# Patient Record
Sex: Female | Born: 1950
Health system: Southern US, Community
[De-identification: ages and names within clinical notes are randomized; demographics above are authoritative.]

## PROBLEM LIST (undated history)

## (undated) ENCOUNTER — Ambulatory Visit (HOSPITAL_COMMUNITY): Payer: No Typology Code available for payment source

## (undated) DIAGNOSIS — F329 Major depressive disorder, single episode, unspecified: Secondary | ICD-10-CM

## (undated) DIAGNOSIS — E669 Obesity, unspecified: Secondary | ICD-10-CM

## (undated) DIAGNOSIS — F32A Depression, unspecified: Secondary | ICD-10-CM

## (undated) DIAGNOSIS — K219 Gastro-esophageal reflux disease without esophagitis: Secondary | ICD-10-CM

## (undated) DIAGNOSIS — I10 Essential (primary) hypertension: Secondary | ICD-10-CM

## (undated) DIAGNOSIS — M1990 Unspecified osteoarthritis, unspecified site: Secondary | ICD-10-CM

## (undated) HISTORY — PX: ANKLE SURGERY: SHX546

## (undated) HISTORY — DX: Essential (primary) hypertension: I10

## (undated) HISTORY — DX: Gastro-esophageal reflux disease without esophagitis: K21.9

## (undated) HISTORY — DX: Major depressive disorder, single episode, unspecified: F32.9

## (undated) HISTORY — DX: Depression, unspecified: F32.A

## (undated) HISTORY — DX: Obesity, unspecified: E66.9

---

## 1997-09-18 ENCOUNTER — Emergency Department (HOSPITAL_COMMUNITY): Admission: EM | Admit: 1997-09-18 | Discharge: 1997-09-18 | Payer: Self-pay | Admitting: Emergency Medicine

## 1999-07-30 ENCOUNTER — Encounter: Payer: Self-pay | Admitting: Family Medicine

## 1999-07-30 ENCOUNTER — Encounter: Admission: RE | Admit: 1999-07-30 | Discharge: 1999-07-30 | Payer: Self-pay | Admitting: Family Medicine

## 1999-08-13 ENCOUNTER — Encounter: Payer: Self-pay | Admitting: Family Medicine

## 1999-08-13 ENCOUNTER — Encounter: Admission: RE | Admit: 1999-08-13 | Discharge: 1999-08-13 | Payer: Self-pay | Admitting: Family Medicine

## 1999-09-01 ENCOUNTER — Encounter: Admission: RE | Admit: 1999-09-01 | Discharge: 1999-11-30 | Payer: Self-pay | Admitting: Family Medicine

## 2000-03-07 ENCOUNTER — Encounter: Admission: RE | Admit: 2000-03-07 | Discharge: 2000-03-07 | Payer: Self-pay | Admitting: *Deleted

## 2000-03-07 ENCOUNTER — Encounter: Payer: Self-pay | Admitting: *Deleted

## 2000-03-23 ENCOUNTER — Ambulatory Visit (HOSPITAL_COMMUNITY): Admission: RE | Admit: 2000-03-23 | Discharge: 2000-03-23 | Payer: Self-pay | Admitting: *Deleted

## 2000-03-23 ENCOUNTER — Encounter (INDEPENDENT_AMBULATORY_CARE_PROVIDER_SITE_OTHER): Payer: Self-pay | Admitting: *Deleted

## 2001-06-15 ENCOUNTER — Other Ambulatory Visit: Admission: RE | Admit: 2001-06-15 | Discharge: 2001-06-15 | Payer: Self-pay | Admitting: Obstetrics and Gynecology

## 2001-10-18 ENCOUNTER — Encounter: Payer: Self-pay | Admitting: Family Medicine

## 2001-10-18 ENCOUNTER — Encounter: Admission: RE | Admit: 2001-10-18 | Discharge: 2001-10-18 | Payer: Self-pay | Admitting: Family Medicine

## 2002-05-02 ENCOUNTER — Emergency Department (HOSPITAL_COMMUNITY): Admission: EM | Admit: 2002-05-02 | Discharge: 2002-05-02 | Payer: Self-pay | Admitting: Emergency Medicine

## 2002-05-02 ENCOUNTER — Encounter: Payer: Self-pay | Admitting: Emergency Medicine

## 2002-05-21 ENCOUNTER — Encounter: Payer: Self-pay | Admitting: Family Medicine

## 2002-05-21 ENCOUNTER — Encounter: Admission: RE | Admit: 2002-05-21 | Discharge: 2002-05-21 | Payer: Self-pay | Admitting: Family Medicine

## 2003-01-14 ENCOUNTER — Ambulatory Visit (HOSPITAL_COMMUNITY): Admission: RE | Admit: 2003-01-14 | Discharge: 2003-01-14 | Payer: Self-pay | Admitting: Orthopedic Surgery

## 2003-01-14 ENCOUNTER — Ambulatory Visit (HOSPITAL_BASED_OUTPATIENT_CLINIC_OR_DEPARTMENT_OTHER): Admission: RE | Admit: 2003-01-14 | Discharge: 2003-01-14 | Payer: Self-pay | Admitting: Orthopedic Surgery

## 2003-07-04 ENCOUNTER — Encounter: Admission: RE | Admit: 2003-07-04 | Discharge: 2003-07-04 | Payer: Self-pay | Admitting: Family Medicine

## 2003-07-30 ENCOUNTER — Ambulatory Visit (HOSPITAL_COMMUNITY): Admission: RE | Admit: 2003-07-30 | Discharge: 2003-07-30 | Payer: Self-pay | Admitting: *Deleted

## 2003-07-30 ENCOUNTER — Encounter (INDEPENDENT_AMBULATORY_CARE_PROVIDER_SITE_OTHER): Payer: Self-pay | Admitting: *Deleted

## 2003-09-16 ENCOUNTER — Other Ambulatory Visit: Admission: RE | Admit: 2003-09-16 | Discharge: 2003-09-16 | Payer: Self-pay | Admitting: Obstetrics and Gynecology

## 2004-07-06 ENCOUNTER — Encounter: Admission: RE | Admit: 2004-07-06 | Discharge: 2004-07-06 | Payer: Self-pay | Admitting: Family Medicine

## 2004-10-21 ENCOUNTER — Other Ambulatory Visit: Admission: RE | Admit: 2004-10-21 | Discharge: 2004-10-21 | Payer: Self-pay | Admitting: Obstetrics and Gynecology

## 2005-03-30 ENCOUNTER — Encounter: Admission: RE | Admit: 2005-03-30 | Discharge: 2005-03-30 | Payer: Self-pay | Admitting: Family Medicine

## 2005-06-01 ENCOUNTER — Emergency Department (HOSPITAL_COMMUNITY): Admission: EM | Admit: 2005-06-01 | Discharge: 2005-06-01 | Payer: Self-pay | Admitting: Family Medicine

## 2005-06-03 ENCOUNTER — Encounter: Admission: RE | Admit: 2005-06-03 | Discharge: 2005-06-03 | Payer: Self-pay | Admitting: Family Medicine

## 2005-07-07 ENCOUNTER — Encounter: Admission: RE | Admit: 2005-07-07 | Discharge: 2005-07-07 | Payer: Self-pay | Admitting: Family Medicine

## 2005-09-28 ENCOUNTER — Emergency Department (HOSPITAL_COMMUNITY): Admission: EM | Admit: 2005-09-28 | Discharge: 2005-09-28 | Payer: Self-pay | Admitting: Family Medicine

## 2005-10-25 ENCOUNTER — Other Ambulatory Visit: Admission: RE | Admit: 2005-10-25 | Discharge: 2005-10-25 | Payer: Self-pay | Admitting: Obstetrics and Gynecology

## 2006-03-24 ENCOUNTER — Emergency Department (HOSPITAL_COMMUNITY): Admission: EM | Admit: 2006-03-24 | Discharge: 2006-03-24 | Payer: Self-pay | Admitting: Emergency Medicine

## 2006-04-14 ENCOUNTER — Encounter: Admission: RE | Admit: 2006-04-14 | Discharge: 2006-04-14 | Payer: Self-pay | Admitting: Family Medicine

## 2006-07-10 ENCOUNTER — Encounter: Admission: RE | Admit: 2006-07-10 | Discharge: 2006-07-10 | Payer: Self-pay | Admitting: Obstetrics and Gynecology

## 2006-10-11 ENCOUNTER — Encounter: Admission: RE | Admit: 2006-10-11 | Discharge: 2006-10-11 | Payer: Self-pay | Admitting: Family Medicine

## 2007-07-16 ENCOUNTER — Encounter: Admission: RE | Admit: 2007-07-16 | Discharge: 2007-07-16 | Payer: Self-pay | Admitting: Family Medicine

## 2007-11-14 ENCOUNTER — Encounter: Admission: RE | Admit: 2007-11-14 | Discharge: 2007-11-14 | Payer: Self-pay | Admitting: Obstetrics and Gynecology

## 2008-07-22 ENCOUNTER — Encounter: Admission: RE | Admit: 2008-07-22 | Discharge: 2008-07-22 | Payer: Self-pay | Admitting: Family Medicine

## 2008-10-02 ENCOUNTER — Emergency Department (HOSPITAL_COMMUNITY): Admission: EM | Admit: 2008-10-02 | Discharge: 2008-10-02 | Payer: Self-pay | Admitting: Emergency Medicine

## 2009-08-28 ENCOUNTER — Encounter: Admission: RE | Admit: 2009-08-28 | Discharge: 2009-08-28 | Payer: Self-pay | Admitting: Family Medicine

## 2010-06-25 NOTE — Op Note (Signed)
NAME:  Ashley Keith, Ashley Keith                      ACCOUNT NO.:  [id number]   MEDICAL RECORD NO.:  [id number]                   PATIENT TYPE:  AMB   LOCATION:  DSC                                  FACILITY:  [facility]   PHYSICIAN:  Leonides Grills, M.D.                  DATE OF BIRTH:  [birth year]   DATE OF PROCEDURE:  01/14/2003  DATE OF DISCHARGE:                                 OPERATIVE REPORT   PREOPERATIVE DIAGNOSIS:  1. Left calcific Achilles tendinitis.  2. Left tight gastroc.  3. Left Haglund's deformity.   POSTOPERATIVE DIAGNOSIS:  1. Left calcific Achilles tendinitis.  2. Left tight gastroc.  3. Left Haglund's deformity.  4. Above plus left Achilles tendon tear.   OPERATION PERFORMED:  1. Excision of left calcification within the Achilles tendon.  2. Excision of left Haglund's deformity.  3. Left gastroc slide.  4. Left primary repair of Achilles tendon.  5. Stress x-rays of ankle.   SURGEON:  Leonides Grills, M.D.   ASSISTANT:  Lianne Cure, P.A.   ANESTHESIA:  General endotracheal tube.   ESTIMATED BLOOD LOSS:  Minimal.   TOURNIQUET TIME:  Approximately hour and a half.   COMPLICATIONS:  None.   DISPOSITION:  Stable to PR.   INDICATIONS FOR PROCEDURE:  The patient is a 60 year old female who has had  a persistent left posterior heel pain that is interfering in her life to the  point that she cannot do what she wants to despite conservative management.  The patient  has consented for the above procedure.  All risks which include  infection, neurovascular injury, persistent pain, worsening pain, stiffness,  rupture of the Achilles tendon were all explained, questions were encouraged  and answered.   DESCRIPTION OF PROCEDURE:  The patient was brought to the operating room and  placed in supine position initially after adequate general endotracheal tube  anesthesia was administered as well as Ancef 1 g IV piggyback.  A tourniquet  was placed on the left thigh and  the patient was placed in the prone  position.  All bony prominences were well padded.  The left lower extremity  was then prepped and draped in sterile manner.  A longitudinal incision over  the medial head of the gastrocnemius tendon was then made.  Dissection was  carried down through skin.  Subcutaneous was dissected down to the fascia.  The fascia was opened in line with the incision.  The conjoined region  between the gastroc soleus was then developed.  Soft tissue was then  elevated off the posterior aspect of the gastrocnemius tendon.  The sural  nerve was identified and protected posteriorly.  The gastroc tendon was then  released with the Mayo scissors.  This had an excellent release of the tight  gastroc.  The wound was copiously irrigated with normal saline.  Subcutaneous tissue was closed with 3-0 Vicryl and the skin was closed with  4-0 Monocryl subcuticular stitch.  Steri-Strips were applied.  The limb was  gravity exsanguinated and the tourniquet elevated to 290 mmHg.  A  longitudinal incision over the anteromedial and anterolateral aspects of the  Achilles tendon was then made.  Dissection was then carried down to bone,  soft tissue was then elevated off the retrocalcaneal space.  With a sagittal  saw, Haglund's deformity was then removed.  This had an excellent  decompression of this area.  Synovitis was removed also with a rongeur.  Then with a curved 1/4 inch osteotome as well as the scalpel and rongeur,  calcification within the Achilles tendon was the removed.  Once this was  meticulously removed, a C-arm view was obtained that showed complete removal  of the calcification within the Achilles tendon.  Because of the compromised  nature of the Achilles tendon with the tear from the tendinosis and  compromised nature of the calcification within the Achilles tendon, a  primary Achilles tendon repair was then performed to the calcaneal tuber  using 5 mm absorbable core  screw suture anchors with #2 FiberWire.  This had  an excellent repair. This was repaired after the area was copiously  irrigated with normal saline and a stress x-ray was obtained and showed that  the area was completely clean of any bony debris in the area as well as the  fact that the tendon was in continuity posteriorly.  Once the wound was  copiously irrigated with normal saline and the primary Achilles tendon  repair was performed, the tourniquet was deflated and hemostasis was  obtained.  The subcutaneous was closed with 3-0 Vicryl.  The skin was closed  with 4-0 nylon.  Sterile dressing was applied.  A modified Jones dressing  was applied with gravity equinus.  The patient was stable to the PR.                                               Leonides Grills, M.D.    PB/MEDQ  D:  01/14/2003  T:  01/15/2003  Job:  [id number]

## 2010-08-18 ENCOUNTER — Other Ambulatory Visit: Payer: Self-pay | Admitting: Family Medicine

## 2010-08-18 DIAGNOSIS — Z1231 Encounter for screening mammogram for malignant neoplasm of breast: Secondary | ICD-10-CM

## 2010-09-06 ENCOUNTER — Ambulatory Visit
Admission: RE | Admit: 2010-09-06 | Discharge: 2010-09-06 | Disposition: A | Discharge status: Home / Self Care | Payer: BC Managed Care – HMO | Source: Ambulatory Visit | Attending: Family Medicine | Admitting: Family Medicine

## 2010-09-06 DIAGNOSIS — Z1231 Encounter for screening mammogram for malignant neoplasm of breast: Secondary | ICD-10-CM

## 2011-01-27 ENCOUNTER — Ambulatory Visit
Admission: RE | Admit: 2011-01-27 | Discharge: 2011-01-27 | Disposition: A | Discharge status: Home / Self Care | Payer: BC Managed Care – HMO | Source: Ambulatory Visit | Attending: Family Medicine | Admitting: Family Medicine

## 2011-01-27 ENCOUNTER — Other Ambulatory Visit: Payer: Self-pay | Admitting: Family Medicine

## 2011-01-27 DIAGNOSIS — M25522 Pain in left elbow: Secondary | ICD-10-CM

## 2011-03-09 ENCOUNTER — Ambulatory Visit (INDEPENDENT_AMBULATORY_CARE_PROVIDER_SITE_OTHER): Payer: BC Managed Care – HMO | Admitting: Internal Medicine

## 2011-03-09 DIAGNOSIS — R059 Cough, unspecified: Secondary | ICD-10-CM

## 2011-03-09 DIAGNOSIS — E119 Type 2 diabetes mellitus without complications: Secondary | ICD-10-CM

## 2011-03-09 DIAGNOSIS — IMO0001 Reserved for inherently not codable concepts without codable children: Secondary | ICD-10-CM | POA: Insufficient documentation

## 2011-03-09 DIAGNOSIS — I10 Essential (primary) hypertension: Secondary | ICD-10-CM | POA: Insufficient documentation

## 2011-03-09 DIAGNOSIS — R05 Cough: Secondary | ICD-10-CM

## 2011-03-09 LAB — POCT CBC
Granulocyte percent: 49.8 %G (ref 37–80)
Lymph, poc: 3.1 (ref 0.6–3.4)
MCH, POC: 29.9 pg (ref 27–31.2)
MCHC: 31.9 g/dL (ref 31.8–35.4)
MID (cbc): 0.5 (ref 0–0.9)
RBC: 4.11 M/uL (ref 4.04–5.48)
RDW, POC: 13.4 %
WBC: 7.3 10*3/uL (ref 4.6–10.2)

## 2011-03-09 MED ORDER — ALBUTEROL SULFATE HFA 108 (90 BASE) MCG/ACT IN AERS: 2.0000 | INHALATION_SPRAY | Freq: Four times a day (QID) | RESPIRATORY_TRACT | Status: DC | PRN

## 2011-03-09 MED ORDER — AZITHROMYCIN 250 MG PO TABS: ORAL_TABLET | ORAL | Status: AC

## 2011-03-09 MED ORDER — ALBUTEROL SULFATE (2.5 MG/3ML) 0.083% IN NEBU
2.5000 mg | INHALATION_SOLUTION | Freq: Once | RESPIRATORY_TRACT | Status: AC
  Administered 2011-03-09: 2.5 mg via RESPIRATORY_TRACT

## 2011-03-09 NOTE — Progress Notes (Signed)
  Subjective:    Patient ID: Ashley Keith, female    DOB: April 16, 1950, 61 y.o.   MRN: [id number]  Cough This is a new problem. The current episode started 1 to 4 weeks ago. The problem has been gradually worsening. The cough is non-productive. Associated symptoms include headaches. Pertinent negatives include no ear pain, fever, nasal congestion or postnasal drip. She has tried OTC cough suppressant for the symptoms. The treatment provided no relief. There is no history of asthma or COPD.      Review of Systems  Constitutional: Negative for fever.  HENT: Negative for ear pain and postnasal drip.   Respiratory: Positive for cough.   Neurological: Positive for headaches.       Objective:   Physical Exam  Constitutional: She is oriented to person, place, and time. She appears well-developed and well-nourished.  HENT:  Head: Normocephalic.  Right Ear: External ear normal.  Left Ear: External ear normal.  Neck: Neck supple.  Cardiovascular: Normal rate, regular rhythm and normal heart sounds.   Abdominal: Soft.  Musculoskeletal: Normal range of motion.  Neurological: She is alert and oriented to person, place, and time.  Skin: Skin is warm and dry.          Assessment & Plan:  Patient regularly seen by Dr. Kevan Ny, her PCP, last AIC was 7.2.  Today's am FBS was 209, she states this is higher than her usual am sugars.

## 2011-03-09 NOTE — Patient Instructions (Signed)
Patient advised to take medications and RTC if not improved in 48-72 hours.

## 2011-04-22 ENCOUNTER — Emergency Department (HOSPITAL_COMMUNITY)
Admission: EM | Admit: 2011-04-22 | Discharge: 2011-04-22 | Disposition: A | Discharge status: Home / Self Care | Payer: BC Managed Care – HMO | Source: Home / Self Care

## 2011-04-22 ENCOUNTER — Encounter (HOSPITAL_COMMUNITY): Payer: Self-pay | Admitting: *Deleted

## 2011-04-22 ENCOUNTER — Emergency Department (INDEPENDENT_AMBULATORY_CARE_PROVIDER_SITE_OTHER): Payer: BC Managed Care – PPO

## 2011-04-22 DIAGNOSIS — S92919A Unspecified fracture of unspecified toe(s), initial encounter for closed fracture: Secondary | ICD-10-CM

## 2011-04-22 DIAGNOSIS — S92501A Displaced unspecified fracture of right lesser toe(s), initial encounter for closed fracture: Secondary | ICD-10-CM

## 2011-04-22 NOTE — ED Provider Notes (Signed)
Medical screening examination/treatment/procedure(s) were performed by non-physician practitioner and as supervising physician I was immediately available for consultation/collaboration.  Leslee Home, M.D.   Reuben Likes, MD 04/22/11 2221

## 2011-04-22 NOTE — ED Provider Notes (Signed)
History     CSN: [id number]  Arrival date & time 04/22/11  1842   None     Chief Complaint  Patient presents with  . Foot Injury    (Consider location/radiation/quality/duration/timing/severity/associated sxs/prior treatment) HPI Comments: Patient presents today with complaints of right foot injury. She states that she was walking out of the bathroom when she had her right foot on the doorjamb. She complains of pain primarily in her right little toe, but also has pain in the fourth toe and across the dorsal aspect of her foot. Pain worsens to palpation and with weightbearing. She is since developed pain swelling and bruising. Patient has a history of diabetes.   Past Medical History  Diagnosis Date  . Diabetes mellitus   . Hypertension     History reviewed. No pertinent past surgical history.  History reviewed. No pertinent family history.  History  Substance Use Topics  . Smoking status: Never Smoker   . Smokeless tobacco: Not on file  . Alcohol Use: No    OB History    Grav Para Term Preterm Abortions TAB SAB Ect Mult Living                  Review of Systems  Musculoskeletal: Negative for joint swelling.  Skin: Positive for color change. Negative for wound.  Neurological: Negative for numbness.    Allergies  Review of patient's allergies indicates no known allergies.  Home Medications   Current Outpatient Rx  Name Route Sig Dispense Refill  . ALBUTEROL SULFATE HFA 108 (90 BASE) MCG/ACT IN AERS Inhalation Inhale 2 puffs into the lungs every 6 (six) hours as needed (cough). 1 Inhaler 0  . AMLODIPINE BESYLATE 10 MG PO TABS Oral Take 10 mg by mouth daily.    . ASPIRIN 81 MG PO TABS Oral Take 81 mg by mouth daily.    Marland Kitchen GLIPIZIDE 10 MG PO TABS Oral Take 10 mg by mouth 2 (two) times daily before a meal.    . INSULIN ASPART 100 UNIT/ML [place] SOLN Subcutaneous Inject 34 Units into the skin 1 day or 1 dose.    Marland Kitchen LISINOPRIL 10 MG PO TABS Oral Take 20 mg by mouth daily.     Marland Kitchen METFORMIN HCL 500 MG PO TABS Oral Take 500 mg by mouth 2 (two) times daily with a meal.      BP 171/67  Pulse 59  Temp(Src) 98.7 F (37.1 C) (Oral)  Resp 18  SpO2 99%  Physical Exam  Nursing note and vitals reviewed. Constitutional: She appears well-developed and well-nourished. No distress.  HENT:  Head: Normocephalic and atraumatic.  Musculoskeletal:       Right foot: She exhibits bony tenderness (very TTP 5th digit, moderate TTP 4th digit, 3rd, & 4th distal metatarsals) and swelling (mild, dorsum of foot). She exhibits normal range of motion, normal capillary refill, no deformity and no laceration.       Feet:  Neurological: She is alert.  Skin: Skin is warm and dry.  Psychiatric: She has a normal mood and affect.    ED Course  Procedures (including critical care time)  Labs Reviewed - No data to display Dg Foot Complete Right  04/22/2011  *RADIOLOGY REPORT*  Clinical Data: Right foot pain  RIGHT FOOT COMPLETE - 3+ VIEW  Comparison: None  Findings: Three views of the right foot submitted.  There is nondisplaced fracture or of proximal phalanx right fifth toe. Plantar spur of the calcaneus is noted.  Posterior spur  of the calcaneus at the Achilles tendon insertion.  IMPRESSION: Nondisplaced fracture proximal phalanx right fifth toe.  Plantar and posterior spurring of the calcaneus.  Original Report Authenticated By: Natasha Mead, M.D.     1. Fracture of fifth toe, right, closed       MDM  Xray reviewed by myself and radiologist.  Discussed home care. Pt has appt scheduled with PCP one week from today - will f/u then.         Melody Comas, [place] 04/22/11 2140

## 2011-04-22 NOTE — Discharge Instructions (Signed)
Tape your 4th and 5th toes together for the next 4 weeks. Change the tape once daily. Put a piece of cotton ball or guaze between your toes to prevent skin irritation. Use the orthopedic shoe until you can comfortably wear your own shoes. Tylenol or Ibuprofen as needed for discomfort. You may also elevate and use ice packs for discomfort as needed. Follow up with Dr Kevan Ny in one week as planned. Return sooner if symptoms change or worsen.

## 2011-04-22 NOTE — ED Notes (Signed)
Pt states she hit her right foot hard on a door and heard a crack.  Complaining of pain in her pinky toe and across the bridge of the foot.  Reports swelling.

## 2011-04-29 ENCOUNTER — Ambulatory Visit (INDEPENDENT_AMBULATORY_CARE_PROVIDER_SITE_OTHER): Payer: BC Managed Care – HMO | Admitting: Ophthalmology

## 2011-04-29 DIAGNOSIS — H43819 Vitreous degeneration, unspecified eye: Secondary | ICD-10-CM

## 2011-04-29 DIAGNOSIS — I10 Essential (primary) hypertension: Secondary | ICD-10-CM

## 2011-04-29 DIAGNOSIS — H35039 Hypertensive retinopathy, unspecified eye: Secondary | ICD-10-CM

## 2011-04-29 DIAGNOSIS — E1139 Type 2 diabetes mellitus with other diabetic ophthalmic complication: Secondary | ICD-10-CM

## 2011-04-29 DIAGNOSIS — H2510 Age-related nuclear cataract, unspecified eye: Secondary | ICD-10-CM

## 2011-04-29 DIAGNOSIS — E11319 Type 2 diabetes mellitus with unspecified diabetic retinopathy without macular edema: Secondary | ICD-10-CM

## 2011-05-23 ENCOUNTER — Other Ambulatory Visit: Payer: Self-pay | Admitting: Family Medicine

## 2011-05-23 DIAGNOSIS — M549 Dorsalgia, unspecified: Secondary | ICD-10-CM

## 2011-05-24 ENCOUNTER — Ambulatory Visit
Admission: RE | Admit: 2011-05-24 | Discharge: 2011-05-24 | Disposition: A | Discharge status: Home / Self Care | Payer: BC Managed Care – PPO | Source: Ambulatory Visit | Attending: Family Medicine | Admitting: Family Medicine

## 2011-05-24 DIAGNOSIS — M549 Dorsalgia, unspecified: Secondary | ICD-10-CM

## 2011-06-04 ENCOUNTER — Encounter (HOSPITAL_COMMUNITY): Payer: Self-pay | Admitting: *Deleted

## 2011-06-04 ENCOUNTER — Emergency Department (INDEPENDENT_AMBULATORY_CARE_PROVIDER_SITE_OTHER): Payer: BC Managed Care – PPO

## 2011-06-04 ENCOUNTER — Emergency Department (HOSPITAL_COMMUNITY)
Admission: EM | Admit: 2011-06-04 | Discharge: 2011-06-04 | Disposition: A | Discharge status: Home / Self Care | Payer: BC Managed Care – PPO | Source: Home / Self Care

## 2011-06-04 DIAGNOSIS — J189 Pneumonia, unspecified organism: Secondary | ICD-10-CM

## 2011-06-04 HISTORY — DX: Unspecified osteoarthritis, unspecified site: M19.90

## 2011-06-04 MED ORDER — LEVOFLOXACIN 500 MG PO TABS: 500.0000 mg | ORAL_TABLET | Freq: Every day | ORAL | Status: AC

## 2011-06-04 NOTE — ED Notes (Signed)
Started with severe sore throat approx 2 wks ago that improved some with gargling and Zyrtec, then sxs progressed into productive cough and nasal congestion.  Now has bilat earache, neck pain, chest pain when coughing.  Has tried Mucinex Max Severe, and Coricidin HBP.  Unable to sleep due to coughing.  Crackles noted in right lower lung.

## 2011-06-04 NOTE — Discharge Instructions (Signed)
Thank you for coming in today. You have pneumonia.  Please take the Levaquin daily starting tonight.  Call or go to the emergency room if you get worse, have trouble breathing, have chest pains, or palpitations.  Followup with your doctor in a week especially if you're still feeling bad. Take Tylenol or ibuprofen for fever.  Pneumonia, Adult Pneumonia is an infection of the lungs.  CAUSES Pneumonia may be caused by bacteria or a virus. Usually, these infections are caused by breathing infectious particles into the lungs (respiratory tract). SYMPTOMS   Cough.   Fever.   Chest pain.   Increased rate of breathing.   Wheezing.   Mucus production.  DIAGNOSIS  If you have the common symptoms of pneumonia, your caregiver will typically confirm the diagnosis with a chest X-ray. The X-ray will show an abnormality in the lung (pulmonary infiltrate) if you have pneumonia. Other tests of your blood, urine, or sputum may be done to find the specific cause of your pneumonia. Your caregiver may also do tests (blood gases or pulse oximetry) to see how well your lungs are working. TREATMENT  Some forms of pneumonia may be spread to other people when you cough or sneeze. You may be asked to wear a mask before and during your exam. Pneumonia that is caused by bacteria is treated with antibiotic medicine. Pneumonia that is caused by the influenza virus may be treated with an antiviral medicine. Most other viral infections must run their course. These infections will not respond to antibiotics.  PREVENTION A pneumococcal shot (vaccine) is available to prevent a common bacterial cause of pneumonia. This is usually suggested for:  People over 2 years old.   Patients on chemotherapy.   People with chronic lung problems, such as bronchitis or emphysema.   People with immune system problems.  If you are over 65 or have a high risk condition, you may receive the pneumococcal vaccine if you have not  received it before. In some countries, a routine influenza vaccine is also recommended. This vaccine can help prevent some cases of pneumonia.You may be offered the influenza vaccine as part of your care. If you smoke, it is time to quit. You may receive instructions on how to stop smoking. Your caregiver can provide medicines and counseling to help you quit. HOME CARE INSTRUCTIONS   Cough suppressants may be used if you are losing too much rest. However, coughing protects you by clearing your lungs. You should avoid using cough suppressants if you can.   Your caregiver may have prescribed medicine if he or she thinks your pneumonia is caused by a bacteria or influenza. Finish your medicine even if you start to feel better.   Your caregiver may also prescribe an expectorant. This loosens the mucus to be coughed up.   Only take over-the-counter or prescription medicines for pain, discomfort, or fever as directed by your caregiver.   Do not smoke. Smoking is a common cause of bronchitis and can contribute to pneumonia. If you are a smoker and continue to smoke, your cough may last several weeks after your pneumonia has cleared.   A cold steam vaporizer or humidifier in your room or home may help loosen mucus.   Coughing is often worse at night. Sleeping in a semi-upright position in a recliner or using a couple pillows under your head will help with this.   Get rest as you feel it is needed. Your body will usually let you know when you need  to rest.  SEEK IMMEDIATE MEDICAL CARE IF:   Your illness becomes worse. This is especially true if you are elderly or weakened from any other disease.   You cannot control your cough with suppressants and are losing sleep.   You begin coughing up blood.   You develop pain which is getting worse or is uncontrolled with medicines.   You have a fever.   Any of the symptoms which initially brought you in for treatment are getting worse rather than  better.   You develop shortness of breath or chest pain.  MAKE SURE YOU:   Understand these instructions.   Will watch your condition.   Will get help right away if you are not doing well or get worse.  Document Released: 01/24/2005 Document Revised: 01/13/2011 Document Reviewed: 04/15/2010 [facility] Patient Information 2012 [place], [place].

## 2011-06-04 NOTE — ED Provider Notes (Signed)
Ashley Keith is a 61 y.o. female who presents to Urgent Care today for nasal congestion, cough, sore throat, crackles with breathing and  mild shortness of breath starting 2 weeks ago. Her symptoms have worsened until she presents to clinic today. She has tried Zyrtec, Mucinex and over-the-counter cough and cold medicine. She denies any current shortness of breath chest pains or palpitations fevers or chills.     PMH reviewed. Significant for diabetes and hypertension ROS as above otherwise neg.  no chest pains, palpitations, fevers, chills, abdominal pain nausea or vomiting. Medications reviewed. No current facility-administered medications for this encounter.   Current Outpatient Prescriptions  Medication Sig Dispense Refill  . amLODipine (NORVASC) 10 MG tablet Take 10 mg by mouth daily.      Marland Kitchen aspirin 81 MG tablet Take 81 mg by mouth daily.      Marland Kitchen glipiZIDE (GLUCOTROL) 10 MG tablet Take 10 mg by mouth 2 (two) times daily before a meal.      . insulin glargine (LANTUS) 100 UNIT/ML injection Inject 42 Units into the skin at bedtime.      Marland Kitchen lisinopril (PRINIVIL,ZESTRIL) 10 MG tablet Take 20 mg by mouth daily.      . metFORMIN (GLUCOPHAGE) 500 MG tablet Take 500 mg by mouth 2 (two) times daily with a meal.      . albuterol (PROVENTIL HFA;VENTOLIN HFA) 108 (90 BASE) MCG/ACT inhaler Inhale 2 puffs into the lungs every 6 (six) hours as needed (cough).  1 Inhaler  0  . insulin aspart (NOVOLOG) 100 UNIT/ML injection Inject 34 Units into the skin 1 day or 1 dose.      . levofloxacin (LEVAQUIN) 500 MG tablet Take 1 tablet (500 mg total) by mouth daily.  10 tablet  0    Exam:  BP 140/79  Pulse 80  Temp(Src) 100.8 F (38.2 C) (Oral)  Resp 24  SpO2 100% Gen: Well NAD HEENT: EOMI,  MMM normal tympanic membranes bilaterally Lungs: Normal work of breathing. Crackles present in the right lower lung fields Heart: RRR no MRG Abd: NABS, NT, ND Exts: Non edematous BL  LE, warm and well perfused.     No results found for this or any previous visit (from the past 24 hour(s)). Dg Chest 2 View  06/04/2011  *RADIOLOGY REPORT*  Clinical Data: Cough and congestion  CHEST - 2 VIEW  Comparison: 04/14/2006  Findings: Heart size is normal.  The aorta shows calcification and unfolding.  The left lung is clear.  I think there is mild patchy infiltrate at the right base.  No dense consolidation or collapse. No effusions.  No bony abnormalities.  IMPRESSION: Mild patchy infiltrate at the right base consistent with mild pneumonia.  Original Report Authenticated By: Thomasenia Sales, M.D.    Assessment and Plan: 61 y.o. female with pneumonia.   Plan to treat with Levaquin and continuing Tylenol.  She appears to be stable currently with no tachypnea or hypoxemia.  Discussed warning signs or symptoms that would prompt return to health care. Advised that she followup with her primary care doctor. She expresses understanding.     Rodolph Bong, MD 06/04/11 [phone]

## 2011-06-06 NOTE — ED Provider Notes (Signed)
Medical screening examination/treatment/procedure(s) were performed by resident physician or non-physician practitioner and as supervising physician I was immediately available for consultation/collaboration.   Barkley Bruns MD.    Linna Hoff, MD 06/06/11 1740

## 2011-07-15 ENCOUNTER — Other Ambulatory Visit: Payer: Self-pay | Admitting: Family Medicine

## 2011-07-15 ENCOUNTER — Ambulatory Visit
Admission: RE | Admit: 2011-07-15 | Discharge: 2011-07-15 | Disposition: A | Discharge status: Home / Self Care | Payer: BC Managed Care – PPO | Source: Ambulatory Visit | Attending: Family Medicine | Admitting: Family Medicine

## 2011-07-15 DIAGNOSIS — J189 Pneumonia, unspecified organism: Secondary | ICD-10-CM

## 2011-09-23 ENCOUNTER — Other Ambulatory Visit: Payer: Self-pay | Admitting: Family Medicine

## 2011-09-23 DIAGNOSIS — Z1231 Encounter for screening mammogram for malignant neoplasm of breast: Secondary | ICD-10-CM

## 2011-09-27 ENCOUNTER — Ambulatory Visit: Payer: BC Managed Care – PPO

## 2011-12-23 ENCOUNTER — Ambulatory Visit
Admission: RE | Admit: 2011-12-23 | Discharge: 2011-12-23 | Disposition: A | Discharge status: Home / Self Care | Payer: BC Managed Care – PPO | Source: Ambulatory Visit | Attending: Family Medicine | Admitting: Family Medicine

## 2011-12-23 DIAGNOSIS — Z1231 Encounter for screening mammogram for malignant neoplasm of breast: Secondary | ICD-10-CM

## 2012-04-30 ENCOUNTER — Ambulatory Visit (INDEPENDENT_AMBULATORY_CARE_PROVIDER_SITE_OTHER): Payer: BC Managed Care – HMO | Admitting: Ophthalmology

## 2012-09-02 ENCOUNTER — Other Ambulatory Visit: Payer: Self-pay | Admitting: Endocrinology

## 2012-09-02 DIAGNOSIS — IMO0001 Reserved for inherently not codable concepts without codable children: Secondary | ICD-10-CM

## 2012-09-02 DIAGNOSIS — E7800 Pure hypercholesterolemia, unspecified: Secondary | ICD-10-CM

## 2012-09-03 ENCOUNTER — Other Ambulatory Visit: Payer: BC Managed Care – PPO

## 2012-09-06 ENCOUNTER — Ambulatory Visit: Payer: BC Managed Care – PPO | Admitting: Endocrinology

## 2012-10-26 ENCOUNTER — Ambulatory Visit (INDEPENDENT_AMBULATORY_CARE_PROVIDER_SITE_OTHER): Payer: Self-pay | Admitting: Ophthalmology

## 2012-11-16 ENCOUNTER — Ambulatory Visit (INDEPENDENT_AMBULATORY_CARE_PROVIDER_SITE_OTHER): Payer: BC Managed Care – PPO | Admitting: Ophthalmology

## 2012-11-16 DIAGNOSIS — E11319 Type 2 diabetes mellitus with unspecified diabetic retinopathy without macular edema: Secondary | ICD-10-CM

## 2012-11-16 DIAGNOSIS — H35039 Hypertensive retinopathy, unspecified eye: Secondary | ICD-10-CM

## 2012-11-16 DIAGNOSIS — H43819 Vitreous degeneration, unspecified eye: Secondary | ICD-10-CM

## 2012-11-16 DIAGNOSIS — H2510 Age-related nuclear cataract, unspecified eye: Secondary | ICD-10-CM

## 2012-11-16 DIAGNOSIS — I10 Essential (primary) hypertension: Secondary | ICD-10-CM

## 2012-11-16 DIAGNOSIS — E1139 Type 2 diabetes mellitus with other diabetic ophthalmic complication: Secondary | ICD-10-CM

## 2012-11-29 ENCOUNTER — Other Ambulatory Visit: Payer: Self-pay

## 2012-11-29 DIAGNOSIS — Z1231 Encounter for screening mammogram for malignant neoplasm of breast: Secondary | ICD-10-CM

## 2012-12-24 ENCOUNTER — Encounter: Payer: Self-pay | Admitting: *Deleted

## 2012-12-24 ENCOUNTER — Encounter: Payer: Self-pay | Admitting: Interventional Cardiology

## 2012-12-25 ENCOUNTER — Ambulatory Visit (INDEPENDENT_AMBULATORY_CARE_PROVIDER_SITE_OTHER): Payer: BC Managed Care – PPO | Admitting: Interventional Cardiology

## 2012-12-25 ENCOUNTER — Encounter: Payer: Self-pay | Admitting: Interventional Cardiology

## 2012-12-25 VITALS — BP 150/68 | HR 64 | Ht 64.0 in | Wt 241.0 lb

## 2012-12-25 DIAGNOSIS — R079 Chest pain, unspecified: Secondary | ICD-10-CM

## 2012-12-25 DIAGNOSIS — I10 Essential (primary) hypertension: Secondary | ICD-10-CM

## 2012-12-25 DIAGNOSIS — E7800 Pure hypercholesterolemia, unspecified: Secondary | ICD-10-CM

## 2012-12-25 DIAGNOSIS — E669 Obesity, unspecified: Secondary | ICD-10-CM

## 2012-12-25 NOTE — Progress Notes (Signed)
Patient ID: Ashley Keith, female   DOB: 02-15-1950, 62 y.o.   MRN: [id number]     Patient ID: Ashley Keith MRN: [id number] DOB/AGE: [birth year] 62 y.o.   Referring Physician Dr. Shaune Pollack   Reason for Consultation: Chest discomfort  HPI: 62 y/o who has had HTN and a negative cardiac w/u several years ago.  A few weeks ago, she noted some headache,constant or 3 days. Frontal and posterior. Took BC powder. Can't seem to get rid of the headache. Some pressure in chest off for the days with the headache. Non-radiating. Denies nausea, vomiting, diaphoresis. No syncope.  She did have some dizziness and has stumbled.    No further chest tightness in a few weeks.  Since BP has been controlled, no episodes.  SHe has raked the front yard.  She walks  For 30 minutes nearly everyday without problems.  No recent exertional chest pain.     Current Outpatient Prescriptions  Medication Sig Dispense Refill  . amLODipine (NORVASC) 10 MG tablet Take 10 mg by mouth daily.      Marland Kitchen aspirin 81 MG tablet Take 81 mg by mouth daily.      Marland Kitchen glipiZIDE (GLUCOTROL) 10 MG tablet Take 10 mg by mouth 2 (two) times daily before a meal.      . insulin glargine (LANTUS) 100 UNIT/ML injection Inject 42 Units into the skin at bedtime.      Marland Kitchen lisinopril (PRINIVIL,ZESTRIL) 10 MG tablet Take 20 mg by mouth daily.      . metFORMIN (GLUCOPHAGE) 500 MG tablet Take 500 mg by mouth 2 (two) times daily with a meal.      . albuterol (PROVENTIL HFA;VENTOLIN HFA) 108 (90 BASE) MCG/ACT inhaler Inhale 2 puffs into the lungs every 6 (six) hours as needed (cough).  1 Inhaler  0  . insulin aspart (NOVOLOG) 100 UNIT/ML injection Inject 34 Units into the skin 1 day or 1 dose.       No current facility-administered medications for this visit.   Past Medical History  Diagnosis Date  . Diabetes mellitus   . Hypertension   . Arthritis   . Obesity   . GERD (gastroesophageal reflux disease)   . Depression     No family history  on file.  History   Social History  . Marital Status: Single    Spouse Name: N/A    Number of Children: N/A  . Years of Education: N/A   Occupational History  . Not on file.   Social History Main Topics  . Smoking status: Never Smoker   . Smokeless tobacco: Not on file  . Alcohol Use: No  . Drug Use: No  . Sexual Activity: Not on file   Other Topics Concern  . Not on file   Social History Narrative  . No narrative on file    Past Surgical History  Procedure Laterality Date  . Ankle surgery        (Not in a hospital admission)  Review of systems complete and found to be negative unless listed above .  No  vomiting.  No fever chills, No focal weakness,  No palpitations.  Physical Exam: Filed Vitals:   12/25/12 0951  BP: 150/68  Pulse: 64    Weight: 241 lb (109.317 kg)  Physical exam:  [place]/AT EOMI No JVD, No carotid bruit RRR S1S2  No wheezing Soft. NT, nondistended No edema. DP 3+ bilaterally No focal motor or sensory deficits Normal affect  Labs:  Lab Results  Component Value Date   WBC 7.3 03/09/2011   HGB 12.3 03/09/2011   HCT 38.6 03/09/2011   MCV 93.8 03/09/2011   No results found for this basename: NA, K, CL, CO2, BUN, CREATININE, CALCIUM, LABALBU, PROT, BILITOT, ALKPHOS, ALT, AST, GLUCOSE,  in the last 168 hours No results found for this basename: CKTOTAL, CKMB, CKMBINDEX, TROPONINI    No results found for this basename: CHOL   No results found for this basename: HDL   No results found for this basename: LDLCALC   No results found for this basename: TRIG   No results found for this basename: CHOLHDL   No results found for this basename: LDLDIRECT      Radiology: EKG: Sinus bradycardia,  Early repol pattern with ST elevation in the anterior leads of 1-2 mm.    ASSESSMENT AND PLAN:   CP- Unable to complete treadmill test in the past.  She does not think she could finish it now.  She wants to do a chemical stress test.   HTN: Better  controlled.  Elevated blood pressure readings may the cause of her headaches.  She would benefit from weight loss.  Bradycardia: No sx from bradycardia.  Watch for lightheadedness or syncope. In the office, we are able to get her heart rate up to the 80-85 range just with minimal activity using the exam table.  I think with exertion, her heart rate increases appropriately.  Signed:   Fredric Mare, MD, [facility] 12/25/2012, 10:19 AM

## 2012-12-25 NOTE — Patient Instructions (Signed)
Your physician has requested that you have a lexiscan myoview. For further information please visit [email or web address] Please follow instruction sheet, as given.  Your physician recommends that you schedule a follow-up appointment in: 6 weeks with Dr. Eldridge Dace.

## 2012-12-26 DIAGNOSIS — E669 Obesity, unspecified: Secondary | ICD-10-CM | POA: Insufficient documentation

## 2012-12-27 ENCOUNTER — Ambulatory Visit
Admission: RE | Admit: 2012-12-27 | Discharge: 2012-12-27 | Disposition: A | Discharge status: Home / Self Care | Payer: BC Managed Care – PPO | Source: Ambulatory Visit

## 2012-12-27 DIAGNOSIS — Z1231 Encounter for screening mammogram for malignant neoplasm of breast: Secondary | ICD-10-CM

## 2013-01-11 ENCOUNTER — Encounter: Payer: Self-pay | Admitting: Cardiovascular Disease

## 2013-01-11 ENCOUNTER — Ambulatory Visit (HOSPITAL_COMMUNITY): Payer: BC Managed Care – PPO | Attending: Cardiology | Admitting: Radiology

## 2013-01-11 VITALS — BP 113/59 | HR 42 | Ht 64.0 in | Wt 240.0 lb

## 2013-01-11 DIAGNOSIS — E119 Type 2 diabetes mellitus without complications: Secondary | ICD-10-CM | POA: Insufficient documentation

## 2013-01-11 DIAGNOSIS — E7800 Pure hypercholesterolemia, unspecified: Secondary | ICD-10-CM

## 2013-01-11 DIAGNOSIS — Z794 Long term (current) use of insulin: Secondary | ICD-10-CM | POA: Insufficient documentation

## 2013-01-11 DIAGNOSIS — IMO0001 Reserved for inherently not codable concepts without codable children: Secondary | ICD-10-CM

## 2013-01-11 DIAGNOSIS — Z8249 Family history of ischemic heart disease and other diseases of the circulatory system: Secondary | ICD-10-CM | POA: Insufficient documentation

## 2013-01-11 DIAGNOSIS — E785 Hyperlipidemia, unspecified: Secondary | ICD-10-CM | POA: Insufficient documentation

## 2013-01-11 DIAGNOSIS — R079 Chest pain, unspecified: Secondary | ICD-10-CM

## 2013-01-11 DIAGNOSIS — R42 Dizziness and giddiness: Secondary | ICD-10-CM | POA: Insufficient documentation

## 2013-01-11 DIAGNOSIS — I10 Essential (primary) hypertension: Secondary | ICD-10-CM | POA: Insufficient documentation

## 2013-01-11 MED ORDER — TECHNETIUM TC 99M SESTAMIBI GENERIC - CARDIOLITE
33.0000 | Freq: Once | INTRAVENOUS | Status: AC | PRN
  Administered 2013-01-11: 33 via INTRAVENOUS

## 2013-01-11 MED ORDER — REGADENOSON 0.4 MG/5ML IV SOLN
0.4000 mg | Freq: Once | INTRAVENOUS | Status: AC
  Administered 2013-01-11: 0.4 mg via INTRAVENOUS

## 2013-01-11 NOTE — Progress Notes (Signed)
[facility] SITE 3 NUCLEAR MED [address] [place], [place] [address] [phone]    Cardiology Nuclear Med Study  Ashley Keith is a 62 y.o. female     MRN : [id number]     DOB: 31-Mar-1950  Procedure Date: 01/11/2013  Nuclear Med Background Indication for Stress Test:  Evaluation for Ischemia History: No prior known history of CAD Cardiac Risk Factors: Family History - CAD, Hypertension, IDDM,and Lipids  Symptoms:  Chest Pain and Dizziness   Nuclear Pre-Procedure Caffeine/Decaff Intake:  None  12 hrs NPO After: 6:30pm   Lungs:  clear O2 Sat: 98% on room air. IV 0.9% NS with Angio Cath:  22g  IV Site: R Wrist x 1, tolerated well IV Started by:  Ashley Hong, RN  Chest Size (in):  42 Cup Size: B  Height: 5\' 4"  (1.626 m)  Weight:  240 lb (108.863 kg)  BMI:  Body mass index is 41.18 kg/(m^2). Tech Comments:  No Lantus Insulin last night, no diabetic po medications today. Fasting CBG was 131 at 0630 today. Ashley Hong, RN    Nuclear Med Study 1 or 2 day study: 2 day  Stress Test Type:  Treadmill/Lexiscan  Reading MD: Ashley Muss, MD  Order Authorizing Provider:  Lance Muss, MD  Resting Radionuclide: Technetium 35m Sestamibi  Resting Radionuclide Dose: 33.0 mCi on 01/14/13   Stress Radionuclide:  Technetium 64m Sestamibi  Stress Radionuclide Dose: 33.0 mCi on 01/11/13           Stress Protocol Rest HR: 42 Stress HR: 96  Rest BP: 113/59 Stress BP: 146/51  Exercise Time (min): n/a METS: n/a           Dose of Adenosine (mg):  n/a Dose of Lexiscan: 0.4 mg  Dose of Atropine (mg): n/a Dose of Dobutamine: n/a mcg/kg/min (at max HR)  Stress Test Technologist: Ashley Keith, BS-ES  Nuclear Technologist:  Ashley Keith, CNMT     Rest Procedure:  Myocardial perfusion imaging was performed at rest 45 minutes following the intravenous administration of Technetium 55m Sestamibi. Rest ECG: NSR with non-specific ST-T wave changes  Stress Procedure:   The patient received IV Lexiscan 0.4 mg over 15-seconds with concurrent low level exercise and then Technetium 34m Sestamibi was injected at 30-seconds while the patient continued walking one more minute.  Quantitative spect images were obtained after a 45-minute delay.  During the infusion of Lexiscan, patient complained of chest pressure, SOB and headache.  Symptoms began to resolve with the exception of a headache.  Stress ECG: No significant change from baseline ECG  QPS Raw Data Images:  There is a breast shadow that accounts for the anterior attenuation. Stress Images:  Normal homogeneous uptake in all areas of the myocardium. Rest Images:  Normal homogeneous uptake in all areas of the myocardium. Subtraction (SDS):  No evidence of ischemia. Transient Ischemic Dilatation (Normal <1.22):  1.04 Lung/Heart Ratio (Normal <0.45):  0.21  Quantitative Gated Spect Images QGS EDV:  106 ml QGS ESV:  37 ml  Impression Exercise Capacity:  Lexiscan with low level exercise. BP Response:  Normal blood pressure response. Clinical Symptoms:  There is dyspnea.THere was mild Chest pain. ECG Impression:  No significant ST segment change suggestive of ischemia. Comparison with Prior Nuclear Study: No images to compare  Overall Impression:  Low risk stress nuclear study . No clear evidence of ischemia..  LV Ejection Fraction: 66%.  LV Wall Motion:  NL LV Function; NL Wall Motion  Ashley  S., MD, Ashley Keith

## 2013-01-14 ENCOUNTER — Ambulatory Visit (HOSPITAL_COMMUNITY): Payer: BC Managed Care – PPO | Attending: Interventional Cardiology | Admitting: Radiology

## 2013-01-14 DIAGNOSIS — R0989 Other specified symptoms and signs involving the circulatory and respiratory systems: Secondary | ICD-10-CM

## 2013-01-14 DIAGNOSIS — R079 Chest pain, unspecified: Secondary | ICD-10-CM

## 2013-01-14 MED ORDER — TECHNETIUM TC 99M SESTAMIBI GENERIC - CARDIOLITE
33.0000 | Freq: Once | INTRAVENOUS | Status: AC | PRN
  Administered 2013-01-14: 33 via INTRAVENOUS

## 2013-01-21 ENCOUNTER — Other Ambulatory Visit: Payer: Self-pay | Admitting: Family Medicine

## 2013-01-21 DIAGNOSIS — R221 Localized swelling, mass and lump, neck: Secondary | ICD-10-CM

## 2013-02-06 ENCOUNTER — Other Ambulatory Visit: Payer: BC Managed Care – PPO

## 2013-02-06 ENCOUNTER — Ambulatory Visit: Payer: BC Managed Care – PPO | Admitting: Obstetrics and Gynecology

## 2013-02-20 ENCOUNTER — Ambulatory Visit
Admission: RE | Admit: 2013-02-20 | Discharge: 2013-02-20 | Disposition: A | Discharge status: Home / Self Care | Payer: BC Managed Care – PPO | Source: Ambulatory Visit | Attending: Family Medicine | Admitting: Family Medicine

## 2013-02-20 DIAGNOSIS — R221 Localized swelling, mass and lump, neck: Secondary | ICD-10-CM

## 2013-02-20 MED ORDER — IOHEXOL 300 MG/ML  SOLN
75.0000 mL | Freq: Once | INTRAMUSCULAR | Status: AC | PRN
  Administered 2013-02-20: 75 mL via INTRAVENOUS

## 2013-08-30 ENCOUNTER — Other Ambulatory Visit: Payer: Self-pay | Admitting: Internal Medicine

## 2013-08-30 DIAGNOSIS — E042 Nontoxic multinodular goiter: Secondary | ICD-10-CM

## 2013-09-05 ENCOUNTER — Other Ambulatory Visit: Payer: BC Managed Care – PPO

## 2013-09-05 ENCOUNTER — Ambulatory Visit
Admission: RE | Admit: 2013-09-05 | Discharge: 2013-09-05 | Disposition: A | Discharge status: Home / Self Care | Payer: BC Managed Care – PPO | Source: Ambulatory Visit | Attending: Internal Medicine | Admitting: Internal Medicine

## 2013-09-05 DIAGNOSIS — E042 Nontoxic multinodular goiter: Secondary | ICD-10-CM

## 2013-11-18 ENCOUNTER — Ambulatory Visit (INDEPENDENT_AMBULATORY_CARE_PROVIDER_SITE_OTHER): Payer: BC Managed Care – PPO | Admitting: Ophthalmology

## 2013-11-18 DIAGNOSIS — H43813 Vitreous degeneration, bilateral: Secondary | ICD-10-CM

## 2013-11-18 DIAGNOSIS — H35033 Hypertensive retinopathy, bilateral: Secondary | ICD-10-CM

## 2013-11-18 DIAGNOSIS — D3131 Benign neoplasm of right choroid: Secondary | ICD-10-CM

## 2013-11-18 DIAGNOSIS — E11329 Type 2 diabetes mellitus with mild nonproliferative diabetic retinopathy without macular edema: Secondary | ICD-10-CM

## 2013-11-18 DIAGNOSIS — I10 Essential (primary) hypertension: Secondary | ICD-10-CM

## 2013-11-18 DIAGNOSIS — E11319 Type 2 diabetes mellitus with unspecified diabetic retinopathy without macular edema: Secondary | ICD-10-CM

## 2013-12-02 ENCOUNTER — Other Ambulatory Visit: Payer: Self-pay

## 2013-12-02 DIAGNOSIS — Z1231 Encounter for screening mammogram for malignant neoplasm of breast: Secondary | ICD-10-CM

## 2014-01-01 ENCOUNTER — Ambulatory Visit
Admission: RE | Admit: 2014-01-01 | Discharge: 2014-01-01 | Disposition: A | Discharge status: Home / Self Care | Payer: Medicare Other | Source: Ambulatory Visit

## 2014-01-01 DIAGNOSIS — Z1231 Encounter for screening mammogram for malignant neoplasm of breast: Secondary | ICD-10-CM

## 2014-02-24 ENCOUNTER — Encounter (HOSPITAL_COMMUNITY): Payer: Self-pay

## 2014-02-24 ENCOUNTER — Emergency Department (HOSPITAL_COMMUNITY)
Admission: EM | Admit: 2014-02-24 | Discharge: 2014-02-24 | Disposition: A | Discharge status: Home / Self Care | Payer: Medicare Other | Source: Home / Self Care | Attending: Emergency Medicine | Admitting: Emergency Medicine

## 2014-02-24 ENCOUNTER — Ambulatory Visit (HOSPITAL_COMMUNITY)
Admission: RE | Admit: 2014-02-24 | Discharge: 2014-02-24 | Disposition: A | Discharge status: Home / Self Care | Payer: Medicare Other | Source: Ambulatory Visit | Attending: Emergency Medicine | Admitting: Emergency Medicine

## 2014-02-24 DIAGNOSIS — R05 Cough: Secondary | ICD-10-CM | POA: Insufficient documentation

## 2014-02-24 DIAGNOSIS — R059 Cough, unspecified: Secondary | ICD-10-CM

## 2014-02-24 DIAGNOSIS — R6889 Other general symptoms and signs: Secondary | ICD-10-CM

## 2014-02-24 DIAGNOSIS — J018 Other acute sinusitis: Secondary | ICD-10-CM

## 2014-02-24 MED ORDER — AZITHROMYCIN 250 MG PO TABS: ORAL_TABLET | ORAL | Status: DC

## 2014-02-24 MED ORDER — CETIRIZINE HCL 10 MG PO TABS: 10.0000 mg | ORAL_TABLET | Freq: Every day | ORAL | Status: DC

## 2014-02-24 MED ORDER — HYDROCODONE-HOMATROPINE 5-1.5 MG/5ML PO SYRP: 5.0000 mL | ORAL_SOLUTION | Freq: Four times a day (QID) | ORAL | Status: DC | PRN

## 2014-02-24 NOTE — ED Notes (Signed)
C/o URI type symptoms x since 1-12; cough, body aches, congestion, fever. Minimal relief w OTC medications

## 2014-02-24 NOTE — Discharge Instructions (Signed)
You likely have the flu with a sinus infection. Take the z-pac as prescribed. Take zyrtec daily for the next week. Use the cough syrup as needed.  This has a narcotic in it so do not drive while taking it. Get plenty of rest and drink plenty of fluids. Tylenol or ibuprofen for headache, fevers, body aches.  Follow up as needed.

## 2014-02-24 NOTE — ED Provider Notes (Signed)
CSN: [id number]     Arrival date & time 02/24/14  4010 History   First MD Initiated Contact with Patient 02/24/14 [phone]     Chief Complaint  Patient presents with  . URI   (Consider location/radiation/quality/duration/timing/severity/associated sxs/prior Treatment) HPI Ashley Keith is a 64 year old woman here for evaluation of cough. Ashley Keith states Ashley Keith started coughing about 6 days ago. This is associated with sinus pressure, rhinorrhea, nasal congestion, sore throat. Ashley Keith has also had headaches and body aches. Ashley Keith reports a decreased appetite, but is taking fluids well. Ashley Keith denies shortness of breath. Ashley Keith does have some chest pain with coughing. Ashley Keith reports chills and sweats, but has not taken her temperature at home.  Her symptoms have gradually been getting worse.  Past Medical History  Diagnosis Date  . Diabetes mellitus   . Hypertension   . Arthritis   . Obesity   . GERD (gastroesophageal reflux disease)   . Depression    Past Surgical History  Procedure Laterality Date  . Ankle surgery     Family History  Problem Relation Age of Onset  . Hypertension Mother   . Diabetes Mother   . Cerebral aneurysm Mother   . Cerebral aneurysm Father    History  Substance Use Topics  . Smoking status: Never Smoker   . Smokeless tobacco: Not on file  . Alcohol Use: No   OB History    No data available     Review of Systems  Constitutional: Positive for chills, diaphoresis and appetite change. Negative for fever.  HENT: Positive for congestion, rhinorrhea, sinus pressure and sore throat. Negative for trouble swallowing.   Respiratory: Positive for cough. Negative for shortness of breath.   Cardiovascular: Positive for chest pain (with cough).  Gastrointestinal: Negative for nausea, vomiting and diarrhea.  Musculoskeletal: Positive for myalgias.  Neurological: Positive for headaches.    Allergies  Review of patient's allergies indicates no known allergies.  Home Medications   Prior to  Admission medications   Medication Sig Start Date End Date Taking? Authorizing Provider  amLODipine (NORVASC) 10 MG tablet Take 10 mg by mouth daily.   Yes Historical Provider, MD  aspirin 81 MG tablet Take 81 mg by mouth daily.   Yes Historical Provider, MD  glipiZIDE (GLUCOTROL) 10 MG tablet Take 10 mg by mouth 2 (two) times daily before a meal.   Yes Historical Provider, MD  insulin glargine (LANTUS) 100 UNIT/ML injection Inject 42 Units into the skin at bedtime.   Yes Historical Provider, MD  metFORMIN (GLUCOPHAGE) 500 MG tablet Take 500 mg by mouth 2 (two) times daily with a meal.   Yes Historical Provider, MD  albuterol (PROVENTIL HFA;VENTOLIN HFA) 108 (90 BASE) MCG/ACT inhaler Inhale 2 puffs into the lungs every 6 (six) hours as needed (cough). 03/09/11 03/08/12  Kemper Durie, PA-C  azithromycin (ZITHROMAX Z-PAK) 250 MG tablet Take 2 pills today, then 1 pill daily until gone. 02/24/14   Melony Overly, MD  cetirizine (ZYRTEC) 10 MG tablet Take 1 tablet (10 mg total) by mouth daily. 02/24/14   Melony Overly, MD  HYDROcodone-homatropine (HYCODAN) 5-1.5 MG/5ML syrup Take 5 mLs by mouth every 6 (six) hours as needed for cough. 02/24/14   Melony Overly, MD  lisinopril-hydrochlorothiazide (PRINZIDE,ZESTORETIC) 20-25 MG per tablet Take 1 tablet by mouth daily.  12/10/12   Historical Provider, MD  pioglitazone (ACTOS) 45 MG tablet Take 45 mg by mouth daily.  12/10/12   Historical Provider, MD   There were no  vitals taken for this visit. Physical Exam  Constitutional: Ashley Keith is oriented to person, place, and time. Ashley Keith appears well-developed and well-nourished. Ashley Keith appears distressed (appears sick).  HENT:  Head: Normocephalic and atraumatic.  Right Ear: External ear normal.  Left Ear: External ear normal.  Nose: Rhinorrhea present. Right sinus exhibits maxillary sinus tenderness. Right sinus exhibits no frontal sinus tenderness. Left sinus exhibits maxillary sinus tenderness. Left sinus exhibits no frontal  sinus tenderness.  Mouth/Throat: Mucous membranes are not dry. Posterior oropharyngeal erythema (mild) present. No oropharyngeal exudate.  Neck: Neck supple.  Cardiovascular: Normal rate, regular rhythm and normal heart sounds.   No murmur heard. Pulmonary/Chest: Effort normal. No respiratory distress. Ashley Keith has no wheezes. Ashley Keith has no rales.  Decreased movement in left base  Lymphadenopathy:    Ashley Keith has cervical adenopathy.  Neurological: Ashley Keith is alert and oriented to person, place, and time.    ED Course  Procedures (including critical care time) Labs Review Labs Reviewed - No data to display  Imaging Review Dg Chest 2 View  02/24/2014   CLINICAL DATA:  One week history of nonproductive cough  EXAM: CHEST  2 VIEW  COMPARISON:  July 15, 2011  FINDINGS: Lungs are clear. Heart size and pulmonary vascularity are normal. No adenopathy. No bone lesions.  IMPRESSION: No edema or consolidation.   Electronically Signed   By: Lowella Grip M.D.   On: 02/24/2014 10:12     MDM   1. Flu-like symptoms   2. Cough   3. Cough   4. Other acute sinusitis    We'll treat sinusitis with Z-Pak. Ashley Keith is out of the treatment window for Tamiflu. Symptomatic care with Zyrtec, Hycodan. Follow-up as needed.    Melony Overly, MD 02/24/14 1046

## 2014-10-25 ENCOUNTER — Emergency Department (INDEPENDENT_AMBULATORY_CARE_PROVIDER_SITE_OTHER): Payer: Medicare Other

## 2014-10-25 ENCOUNTER — Encounter (HOSPITAL_COMMUNITY): Payer: Self-pay | Admitting: *Deleted

## 2014-10-25 ENCOUNTER — Emergency Department (HOSPITAL_COMMUNITY)
Admission: EM | Admit: 2014-10-25 | Discharge: 2014-10-25 | Disposition: A | Discharge status: Home / Self Care | Payer: Medicare Other | Source: Home / Self Care | Attending: Family Medicine | Admitting: Family Medicine

## 2014-10-25 DIAGNOSIS — M1711 Unilateral primary osteoarthritis, right knee: Secondary | ICD-10-CM

## 2014-10-25 DIAGNOSIS — W19XXXA Unspecified fall, initial encounter: Secondary | ICD-10-CM

## 2014-10-25 MED ORDER — DICLOFENAC SODIUM 1 % TD GEL: 4.0000 g | Freq: Four times a day (QID) | TRANSDERMAL | Status: DC

## 2014-10-25 NOTE — Discharge Instructions (Signed)
Use knee brace, ice pack and medicine as prescribed, see orthopedist if further problems

## 2014-10-25 NOTE — ED Notes (Signed)
Pt  Reports  She  Injured   Her  r  Knee   About  1  Month ago  She  Has  Pain  And  Swelling  Of the  Affected  Knee        She  Reports the  Symptoms           Have  Gotten  Worse  Over  The  Last  sev  Days

## 2014-10-25 NOTE — ED Provider Notes (Signed)
CSN: [id number]     Arrival date & time 10/25/14  1302 History   First MD Initiated Contact with Patient 10/25/14 1314     Chief Complaint  Patient presents with  . Knee Pain   (Consider location/radiation/quality/duration/timing/severity/associated sxs/prior Treatment) Patient is a 64 y.o. female presenting with knee pain. The history is provided by the patient.  Knee Pain Location:  Knee Time since incident:  1 month Injury: yes   Mechanism of injury: fall   Knee location:  R knee Pain details:    Quality:  Sharp   Radiates to:  Does not radiate   Severity:  Moderate Chronicity:  New Dislocation: no     Past Medical History  Diagnosis Date  . Diabetes mellitus   . Hypertension   . Arthritis   . Obesity   . GERD (gastroesophageal reflux disease)   . Depression    Past Surgical History  Procedure Laterality Date  . Ankle surgery     Family History  Problem Relation Age of Onset  . Hypertension Mother   . Diabetes Mother   . Cerebral aneurysm Mother   . Cerebral aneurysm Father    Social History  Substance Use Topics  . Smoking status: Never Smoker   . Smokeless tobacco: None  . Alcohol Use: No   OB History    No data available     Review of Systems  Musculoskeletal: Positive for joint swelling and gait problem.  Skin: Negative.   All other systems reviewed and are negative.   Allergies  Review of patient's allergies indicates no known allergies.  Home Medications   Prior to Admission medications   Medication Sig Start Date End Date Taking? Authorizing Provider  albuterol (PROVENTIL HFA;VENTOLIN HFA) 108 (90 BASE) MCG/ACT inhaler Inhale 2 puffs into the lungs every 6 (six) hours as needed (cough). 03/09/11 03/08/12  Kemper Durie, PA-C  amLODipine (NORVASC) 10 MG tablet Take 10 mg by mouth daily.    Historical Provider, MD  aspirin 81 MG tablet Take 81 mg by mouth daily.    Historical Provider, MD  azithromycin (ZITHROMAX Z-PAK) 250 MG tablet Take 2  pills today, then 1 pill daily until gone. 02/24/14   Melony Overly, MD  cetirizine (ZYRTEC) 10 MG tablet Take 1 tablet (10 mg total) by mouth daily. 02/24/14   Melony Overly, MD  diclofenac sodium (VOLTAREN) 1 % GEL Apply 4 g topically 4 (four) times daily. Please instruct in dosing for knee. 10/25/14   Billy Fischer, MD  glipiZIDE (GLUCOTROL) 10 MG tablet Take 10 mg by mouth 2 (two) times daily before a meal.    Historical Provider, MD  HYDROcodone-homatropine (HYCODAN) 5-1.5 MG/5ML syrup Take 5 mLs by mouth every 6 (six) hours as needed for cough. 02/24/14   Melony Overly, MD  insulin glargine (LANTUS) 100 UNIT/ML injection Inject 42 Units into the skin at bedtime.    Historical Provider, MD  lisinopril-hydrochlorothiazide (PRINZIDE,ZESTORETIC) 20-25 MG per tablet Take 1 tablet by mouth daily.  12/10/12   Historical Provider, MD  metFORMIN (GLUCOPHAGE) 500 MG tablet Take 500 mg by mouth 2 (two) times daily with a meal.    Historical Provider, MD  pioglitazone (ACTOS) 45 MG tablet Take 45 mg by mouth daily.  12/10/12   Historical Provider, MD   Meds Ordered and Administered this Visit  Medications - No data to display  BP 146/79 mmHg  Pulse 53  Temp(Src) 98.7 F (37.1 C) (Oral)  Resp 16  SpO2 97% No data found.   Physical Exam  Constitutional: She appears well-developed and well-nourished. No distress.  Musculoskeletal: She exhibits edema and tenderness.       Right knee: She exhibits decreased range of motion, swelling, effusion and MCL laxity. She exhibits no deformity, normal alignment and normal patellar mobility. Tenderness found. Medial joint line and MCL tenderness noted. No patellar tendon tenderness noted.  Neurological: She is alert.  Skin: Skin is warm and dry.  Nursing note and vitals reviewed.   ED Course  Procedures (including critical care time)  Labs Review Labs Reviewed - No data to display  Imaging Review Dg Knee Complete 4 Views Right  10/25/2014   CLINICAL DATA:   Pt fell "in August" onto her right knee; She continues to have pain anterior right knee  EXAM: RIGHT KNEE - COMPLETE 4+ VIEW  COMPARISON:  None.  FINDINGS: No fracture. No bone lesion. Mild narrowing of the lateral patellofemoral joint space compartment. There are small marginal osteophytes from all 3 compartments. No other arthropathic change.  No joint effusion.  Soft tissues are unremarkable.  IMPRESSION: No fracture or acute finding.  Mild degenerate changes mostly of the patellofemoral joint space compartment.   Electronically Signed   By: Lajean Manes M.D.   On: 10/25/2014 14:08   X-rays reviewed and report per radiologist.   Visual Acuity Review  Right Eye Distance:   Left Eye Distance:   Bilateral Distance:    Right Eye Near:   Left Eye Near:    Bilateral Near:         MDM   1. Primary osteoarthritis of right knee   2. Jonnie Finner, MD 10/25/14 [phone]

## 2014-11-24 ENCOUNTER — Ambulatory Visit (INDEPENDENT_AMBULATORY_CARE_PROVIDER_SITE_OTHER): Payer: BC Managed Care – PPO | Admitting: Ophthalmology

## 2015-02-25 ENCOUNTER — Other Ambulatory Visit: Payer: Self-pay

## 2015-02-25 DIAGNOSIS — Z1231 Encounter for screening mammogram for malignant neoplasm of breast: Secondary | ICD-10-CM

## 2015-03-18 ENCOUNTER — Ambulatory Visit
Admission: RE | Admit: 2015-03-18 | Discharge: 2015-03-18 | Disposition: A | Discharge status: Home / Self Care | Payer: Medicare Other | Source: Ambulatory Visit

## 2015-03-18 DIAGNOSIS — Z1231 Encounter for screening mammogram for malignant neoplasm of breast: Secondary | ICD-10-CM | POA: Diagnosis not present

## 2015-03-31 DIAGNOSIS — M8580 Other specified disorders of bone density and structure, unspecified site: Secondary | ICD-10-CM | POA: Diagnosis not present

## 2015-03-31 DIAGNOSIS — E042 Nontoxic multinodular goiter: Secondary | ICD-10-CM | POA: Diagnosis not present

## 2015-03-31 DIAGNOSIS — Z794 Long term (current) use of insulin: Secondary | ICD-10-CM | POA: Diagnosis not present

## 2015-03-31 DIAGNOSIS — E1165 Type 2 diabetes mellitus with hyperglycemia: Secondary | ICD-10-CM | POA: Diagnosis not present

## 2015-03-31 DIAGNOSIS — M859 Disorder of bone density and structure, unspecified: Secondary | ICD-10-CM | POA: Diagnosis not present

## 2015-03-31 DIAGNOSIS — Z5181 Encounter for therapeutic drug level monitoring: Secondary | ICD-10-CM | POA: Diagnosis not present

## 2015-03-31 DIAGNOSIS — Z79899 Other long term (current) drug therapy: Secondary | ICD-10-CM | POA: Diagnosis not present

## 2015-06-17 DIAGNOSIS — E559 Vitamin D deficiency, unspecified: Secondary | ICD-10-CM | POA: Diagnosis not present

## 2015-06-17 DIAGNOSIS — E1165 Type 2 diabetes mellitus with hyperglycemia: Secondary | ICD-10-CM | POA: Diagnosis not present

## 2015-06-17 DIAGNOSIS — E782 Mixed hyperlipidemia: Secondary | ICD-10-CM | POA: Diagnosis not present

## 2015-06-17 DIAGNOSIS — I10 Essential (primary) hypertension: Secondary | ICD-10-CM | POA: Diagnosis not present

## 2015-08-05 DIAGNOSIS — E1165 Type 2 diabetes mellitus with hyperglycemia: Secondary | ICD-10-CM | POA: Diagnosis not present

## 2015-08-05 DIAGNOSIS — E042 Nontoxic multinodular goiter: Secondary | ICD-10-CM | POA: Diagnosis not present

## 2015-08-05 DIAGNOSIS — E559 Vitamin D deficiency, unspecified: Secondary | ICD-10-CM | POA: Diagnosis not present

## 2015-08-05 DIAGNOSIS — Z794 Long term (current) use of insulin: Secondary | ICD-10-CM | POA: Diagnosis not present

## 2015-08-05 DIAGNOSIS — M8580 Other specified disorders of bone density and structure, unspecified site: Secondary | ICD-10-CM | POA: Diagnosis not present

## 2015-11-25 DIAGNOSIS — Z23 Encounter for immunization: Secondary | ICD-10-CM | POA: Diagnosis not present

## 2015-12-09 DIAGNOSIS — E1165 Type 2 diabetes mellitus with hyperglycemia: Secondary | ICD-10-CM | POA: Diagnosis not present

## 2015-12-09 DIAGNOSIS — M8580 Other specified disorders of bone density and structure, unspecified site: Secondary | ICD-10-CM | POA: Diagnosis not present

## 2015-12-09 DIAGNOSIS — E559 Vitamin D deficiency, unspecified: Secondary | ICD-10-CM | POA: Diagnosis not present

## 2015-12-09 DIAGNOSIS — Z794 Long term (current) use of insulin: Secondary | ICD-10-CM | POA: Diagnosis not present

## 2015-12-09 DIAGNOSIS — E042 Nontoxic multinodular goiter: Secondary | ICD-10-CM | POA: Diagnosis not present

## 2016-03-14 ENCOUNTER — Other Ambulatory Visit: Payer: Self-pay | Admitting: Family Medicine

## 2016-03-14 DIAGNOSIS — Z1231 Encounter for screening mammogram for malignant neoplasm of breast: Secondary | ICD-10-CM

## 2016-03-23 ENCOUNTER — Ambulatory Visit
Admission: RE | Admit: 2016-03-23 | Discharge: 2016-03-23 | Disposition: A | Discharge status: Home / Self Care | Payer: Medicare Other | Source: Ambulatory Visit | Attending: Family Medicine | Admitting: Family Medicine

## 2016-03-23 DIAGNOSIS — Z1231 Encounter for screening mammogram for malignant neoplasm of breast: Secondary | ICD-10-CM | POA: Diagnosis not present

## 2016-08-30 DIAGNOSIS — M8580 Other specified disorders of bone density and structure, unspecified site: Secondary | ICD-10-CM | POA: Diagnosis not present

## 2016-08-30 DIAGNOSIS — Z794 Long term (current) use of insulin: Secondary | ICD-10-CM | POA: Diagnosis not present

## 2016-08-30 DIAGNOSIS — E1165 Type 2 diabetes mellitus with hyperglycemia: Secondary | ICD-10-CM | POA: Diagnosis not present

## 2016-08-30 DIAGNOSIS — E042 Nontoxic multinodular goiter: Secondary | ICD-10-CM | POA: Diagnosis not present

## 2016-09-21 DIAGNOSIS — Z7984 Long term (current) use of oral hypoglycemic drugs: Secondary | ICD-10-CM | POA: Diagnosis not present

## 2016-09-21 DIAGNOSIS — E782 Mixed hyperlipidemia: Secondary | ICD-10-CM | POA: Diagnosis not present

## 2016-09-21 DIAGNOSIS — Z0000 Encounter for general adult medical examination without abnormal findings: Secondary | ICD-10-CM | POA: Diagnosis not present

## 2016-09-21 DIAGNOSIS — Z794 Long term (current) use of insulin: Secondary | ICD-10-CM | POA: Diagnosis not present

## 2016-09-21 DIAGNOSIS — I10 Essential (primary) hypertension: Secondary | ICD-10-CM | POA: Diagnosis not present

## 2016-09-21 DIAGNOSIS — Z1159 Encounter for screening for other viral diseases: Secondary | ICD-10-CM | POA: Diagnosis not present

## 2016-09-21 DIAGNOSIS — E1165 Type 2 diabetes mellitus with hyperglycemia: Secondary | ICD-10-CM | POA: Diagnosis not present

## 2016-11-23 DIAGNOSIS — Z23 Encounter for immunization: Secondary | ICD-10-CM | POA: Diagnosis not present

## 2017-01-25 DIAGNOSIS — E559 Vitamin D deficiency, unspecified: Secondary | ICD-10-CM | POA: Diagnosis not present

## 2017-02-13 ENCOUNTER — Other Ambulatory Visit: Payer: Self-pay | Admitting: Family Medicine

## 2017-02-13 DIAGNOSIS — Z1231 Encounter for screening mammogram for malignant neoplasm of breast: Secondary | ICD-10-CM

## 2017-03-06 ENCOUNTER — Encounter (HOSPITAL_COMMUNITY): Payer: Self-pay | Admitting: Emergency Medicine

## 2017-03-06 ENCOUNTER — Ambulatory Visit (HOSPITAL_COMMUNITY)
Admission: EM | Admit: 2017-03-06 | Discharge: 2017-03-06 | Disposition: A | Discharge status: Home / Self Care | Payer: Medicare Other | Attending: Family Medicine | Admitting: Family Medicine

## 2017-03-06 DIAGNOSIS — R05 Cough: Secondary | ICD-10-CM | POA: Diagnosis not present

## 2017-03-06 DIAGNOSIS — R059 Cough, unspecified: Secondary | ICD-10-CM

## 2017-03-06 MED ORDER — AZITHROMYCIN 250 MG PO TABS: ORAL_TABLET | ORAL | 0 refills | Status: DC

## 2017-03-06 NOTE — Discharge Instructions (Signed)
We will treat her cough and symptoms with an antibiotic since they have been going on for over a week.  Please use azithromycin-2 tablets today then 1 tablet for the following 4 days.  His fracture symptoms to gradually improve over the next week.  Please continue your diabetic tussin and corticeden for congestion and cough.  Take Tylenol every 4 hours for fevers/neck and back pain.  Stay hydrated and drink plenty of fluids.  Please return if symptoms not improving with treatment, or are worsening, develop shortness of breath, chest pain, difficulty breathing, nausea and vomiting,  unresolving fever.

## 2017-03-06 NOTE — ED Provider Notes (Signed)
[facility]    CSN: [id number] Arrival date & time: 03/06/17  1109     History   Chief Complaint Chief Complaint  Patient presents with  . URI    HPI Ashley Keith is a 67 y.o. female.  Past medical history of diabetes and hypertension, patient is presenting today with cough, chest congestion, fevers.  Her symptoms began 9 days ago with a sore throat which has since resolved.  She reports occasional nasal stuffiness but overall minimal congestion.  Earaches, thinks this is related to frequent coughing.  Also having neck, back, leg aching.  She is using diabetic cough syrup and Corticeden.    Denies nausea, vomiting, abdominal pain.  History of asthma, COPD, smoking.  HPI  Past Medical History:  Diagnosis Date  . Arthritis   . Depression   . Diabetes mellitus   . GERD (gastroesophageal reflux disease)   . Hypertension   . Obesity     Patient Active Problem List   Diagnosis Date Noted  . Obesity, unspecified 12/26/2012  . Pure hypercholesterolemia 09/02/2012  . Type II or unspecified type diabetes mellitus without mention of complication, uncontrolled 03/09/2011  . Hypertension 03/09/2011    Past Surgical History:  Procedure Laterality Date  . ANKLE SURGERY      OB History    No data available       Home Medications    Prior to Admission medications   Medication Sig Start Date End Date Taking? Authorizing Provider  amLODipine (NORVASC) 10 MG tablet Take 10 mg by mouth daily.   Yes [provider]  aspirin 81 MG tablet Take 81 mg by mouth daily.   Yes [provider]  cetirizine (ZYRTEC) 10 MG tablet Take 1 tablet (10 mg total) by mouth daily. 02/24/14  Yes Melony Overly, MD  glipiZIDE (GLUCOTROL) 10 MG tablet Take 10 mg by mouth 2 (two) times daily before a meal.   Yes [provider]  insulin glargine (LANTUS) 100 UNIT/ML injection Inject 42 Units into the skin at bedtime.   Yes [provider]    lisinopril-hydrochlorothiazide (PRINZIDE,ZESTORETIC) 20-25 MG per tablet Take 1 tablet by mouth daily.  12/10/12  Yes [provider]  metFORMIN (GLUCOPHAGE) 500 MG tablet Take 500 mg by mouth 2 (two) times daily with a meal.   Yes [provider]  albuterol (PROVENTIL HFA;VENTOLIN HFA) 108 (90 BASE) MCG/ACT inhaler Inhale 2 puffs into the lungs every 6 (six) hours as needed (cough). 03/09/11 03/08/12  Kemper Durie, PA-C  azithromycin (ZITHROMAX Z-PAK) 250 MG tablet Take 2 pills today, then 1 pill daily until gone. 03/06/17   Wieters, Hallie C, PA-C  diclofenac sodium (VOLTAREN) 1 % GEL Apply 4 g topically 4 (four) times daily. Please instruct in dosing for knee. 10/25/14   Billy Fischer, MD  HYDROcodone-homatropine (HYCODAN) 5-1.5 MG/5ML syrup Take 5 mLs by mouth every 6 (six) hours as needed for cough. 02/24/14   Melony Overly, MD  pioglitazone (ACTOS) 45 MG tablet Take 45 mg by mouth daily.  12/10/12   [provider]    Family History Family History  Problem Relation Age of Onset  . Hypertension Mother   . Diabetes Mother   . Cerebral aneurysm Mother   . Breast cancer Mother 66  . Cerebral aneurysm Father   . Breast cancer Maternal Aunt 30    Social History Social History   Tobacco Use  . Smoking status: Never Smoker  . Smokeless tobacco:  Never Used  Substance Use Topics  . Alcohol use: No  . Drug use: No     Allergies   Patient has no known allergies.   Review of Systems Review of Systems  Constitutional: Negative for chills, fatigue and fever.  HENT: Positive for congestion, ear pain, rhinorrhea, sinus pressure and sore throat. Negative for trouble swallowing.   Respiratory: Positive for cough. Negative for chest tightness and shortness of breath.   Cardiovascular: Negative for chest pain.  Gastrointestinal: Negative for abdominal pain, nausea and vomiting.  Musculoskeletal: Negative for myalgias.  Skin: Negative for rash.  Neurological:  Negative for dizziness, light-headedness and headaches.     Physical Exam Triage Vital Signs ED Triage Vitals  Enc Vitals Group     BP 03/06/17 1251 (!) 138/47     Pulse Rate 03/06/17 1251 60     Resp 03/06/17 1251 20     Temp 03/06/17 1251 100 F (37.8 C)     Temp Source 03/06/17 1251 Oral     SpO2 03/06/17 1251 100 %     Weight --      Height --      Head Circumference --      Peak Flow --      Pain Score 03/06/17 1252 5     Pain Loc --      Pain Edu? --      Excl. in [place]? --    No data found.  Updated Vital Signs BP (!) 138/47 (BP Location: Left Arm)   Pulse 60   Temp 100 F (37.8 C) (Oral)   Resp 20   SpO2 100%    Physical Exam  Constitutional: She appears well-developed and well-nourished. No distress.  HENT:  Head: Normocephalic and atraumatic.  Right Ear: Tympanic membrane and ear canal normal.  Left Ear: Tympanic membrane and ear canal normal.  Nose: Rhinorrhea present.  Mouth/Throat: Uvula is midline, oropharynx is clear and moist and mucous membranes are normal. No oral lesions. No trismus in the jaw. No uvula swelling. No posterior oropharyngeal erythema. Tonsils are 0 on the right. Tonsils are 0 on the left.  Erythematous and swollen turbinates  Eyes: Conjunctivae are normal.  Neck: Neck supple.  Cardiovascular: Normal rate and regular rhythm.  No murmur heard. Pulmonary/Chest: Effort normal and breath sounds normal. No respiratory distress.  And comfortably at rest, lungs clear to auscultation bilaterally, no adventitious sounds appreciated  Abdominal: Soft. There is no tenderness.  Musculoskeletal: She exhibits no edema.  Neurological: She is alert.  Skin: Skin is warm and dry.  Psychiatric: She has a normal mood and affect.  Nursing note and vitals reviewed.    UC Treatments / Results  Labs (all labs ordered are listed, but only abnormal results are displayed) Labs Reviewed - No data to display  EKG  EKG Interpretation None        Radiology No results found.  Procedures Procedures (including critical care time)  Medications Ordered in UC Medications - No data to display   Initial Impression / Assessment and Plan / UC Course  I have reviewed the triage vital signs and the nursing notes.  Pertinent labs & imaging results that were available during my care of the patient were reviewed by me and considered in my medical decision making (see chart for details).     Patient presents with symptoms likely from a viral upper respiratory infection vs. Sinusitis vs. Penumonia.  Influenza less likely given symptoms have been going on for  9 days although patient feels fevers began yesterday. Patient is nontoxic appearing and not in need of emergent medical intervention.  Vital signs stable, no tachycardia O2 saturation 100%.  Treat with azithromycin to cover any atypical pneumonia and/or sinusitis. Continue diabetic and hypertension cold medications.  Return if symptoms fail to improve in 1-2 weeks or you develop shortness of breath, chest pain, severe headache. Patient states understanding and is agreeable. Discussed strict return precautions. Patient verbalized understanding and is agreeable with plan.     Final Clinical Impressions(s) / UC Diagnoses   Final diagnoses:  Cough    ED Discharge Orders        Ordered    azithromycin (ZITHROMAX Z-PAK) 250 MG tablet     03/06/17 1328       Controlled Substance Prescriptions [place] Controlled Substance Registry consulted? Not Applicable   Janith Lima, [place] 03/06/17 1339

## 2017-03-06 NOTE — ED Triage Notes (Signed)
PT C/O: cold sx onset 9 days associated w/cough, chest congestion, back pain, neck pain, bilateral neck pain, fevers  TAKING MEDS: Robitussin   A&O x4... NAD... Ambulatory

## 2017-03-29 ENCOUNTER — Ambulatory Visit
Admission: RE | Admit: 2017-03-29 | Discharge: 2017-03-29 | Disposition: A | Discharge status: Home / Self Care | Payer: Medicare Other | Source: Ambulatory Visit | Attending: Family Medicine | Admitting: Family Medicine

## 2017-03-29 DIAGNOSIS — Z1231 Encounter for screening mammogram for malignant neoplasm of breast: Secondary | ICD-10-CM

## 2017-08-16 ENCOUNTER — Other Ambulatory Visit: Payer: Self-pay

## 2017-08-16 ENCOUNTER — Encounter (HOSPITAL_COMMUNITY): Payer: Self-pay | Admitting: Emergency Medicine

## 2017-08-16 ENCOUNTER — Ambulatory Visit (INDEPENDENT_AMBULATORY_CARE_PROVIDER_SITE_OTHER): Payer: Medicare Other

## 2017-08-16 ENCOUNTER — Ambulatory Visit (HOSPITAL_COMMUNITY)
Admission: EM | Admit: 2017-08-16 | Discharge: 2017-08-16 | Disposition: A | Discharge status: Home / Self Care | Payer: Medicare Other | Attending: Family Medicine | Admitting: Family Medicine

## 2017-08-16 DIAGNOSIS — I2510 Atherosclerotic heart disease of native coronary artery without angina pectoris: Secondary | ICD-10-CM | POA: Diagnosis not present

## 2017-08-16 DIAGNOSIS — R0789 Other chest pain: Secondary | ICD-10-CM | POA: Diagnosis not present

## 2017-08-16 DIAGNOSIS — R0602 Shortness of breath: Secondary | ICD-10-CM

## 2017-08-16 DIAGNOSIS — J40 Bronchitis, not specified as acute or chronic: Secondary | ICD-10-CM | POA: Diagnosis not present

## 2017-08-16 DIAGNOSIS — R05 Cough: Secondary | ICD-10-CM | POA: Diagnosis not present

## 2017-08-16 DIAGNOSIS — I7090 Unspecified atherosclerosis: Secondary | ICD-10-CM | POA: Diagnosis not present

## 2017-08-16 MED ORDER — ALBUTEROL SULFATE HFA 108 (90 BASE) MCG/ACT IN AERS: 2.0000 | INHALATION_SPRAY | Freq: Once | RESPIRATORY_TRACT | Status: DC

## 2017-08-16 MED ORDER — ALBUTEROL SULFATE HFA 108 (90 BASE) MCG/ACT IN AERS
INHALATION_SPRAY | RESPIRATORY_TRACT | Status: AC
  Filled 2017-08-16: qty 6.7

## 2017-08-16 MED ORDER — BENZONATATE 100 MG PO CAPS: 100.0000 mg | ORAL_CAPSULE | Freq: Three times a day (TID) | ORAL | 0 refills | Status: DC | PRN

## 2017-08-16 NOTE — ED Provider Notes (Signed)
[place]   [id number] 08/16/17 Arrival Time: 2585  SUBJECTIVE:  Ashley Keith is a 67 y.o. female hx significant for HTN, DM, and GERD who presents with worsening cough for 3 weeks.  Denies positive sick exposure or precipitating event.  Describes cough as constant and productive with thick yellow sputum.  Has tried mucinex with temporary relief.  Symptoms are made worse with laying down at night.  Denies previous symptoms in the past.  Complains of subjective fever, SOB, and chest discomfort.   Patient also reports constant chest discomfort that started 4 days ago.  Does not attribute this to her cough.  States it is always there.  Denies aggravating or alleviating factors.    Denies fever, chills, fatigue, sinus pain, rhinorrhea, sore throat, wheezing, chest pain, nausea, changes in bowel or bladder habits.    ROS: As per HPI.  Past Medical History:  Diagnosis Date  . Arthritis   . Depression   . Diabetes mellitus   . GERD (gastroesophageal reflux disease)   . Hypertension   . Obesity    Past Surgical History:  Procedure Laterality Date  . ANKLE SURGERY     No Known Allergies No current facility-administered medications on file prior to encounter.    Current Outpatient Medications on File Prior to Encounter  Medication Sig Dispense Refill  . insulin glargine (LANTUS) 100 UNIT/ML injection Inject into the skin daily.    [facility] Kitchen albuterol (PROVENTIL HFA;VENTOLIN HFA) 108 (90 BASE) MCG/ACT inhaler Inhale 2 puffs into the lungs every 6 (six) hours as needed (cough). 1 Inhaler 0  . amLODipine (NORVASC) 10 MG tablet Take 10 mg by mouth daily.    [facility] Kitchen aspirin 81 MG tablet Take 81 mg by mouth daily.    [facility] Kitchen azithromycin (ZITHROMAX Z-PAK) 250 MG tablet Take 2 pills today, then 1 pill daily until gone. 6 tablet 0  . cetirizine (ZYRTEC) 10 MG tablet Take 1 tablet (10 mg total) by mouth daily. 30 tablet 0  . diclofenac sodium (VOLTAREN) 1 % GEL Apply 4 g topically 4 (four) times  daily. Please instruct in dosing for knee. 1 Tube 1  . glipiZIDE (GLUCOTROL) 10 MG tablet Take 10 mg by mouth 2 (two) times daily before a meal.    . HYDROcodone-homatropine (HYCODAN) 5-1.5 MG/5ML syrup Take 5 mLs by mouth every 6 (six) hours as needed for cough. 120 mL 0  . insulin glargine (LANTUS) 100 UNIT/ML injection Inject 42 Units into the skin at bedtime.    [facility] Kitchen lisinopril-hydrochlorothiazide (PRINZIDE,ZESTORETIC) 20-25 MG per tablet Take 1 tablet by mouth daily.     . metFORMIN (GLUCOPHAGE) 500 MG tablet Take 500 mg by mouth 2 (two) times daily with a meal.    . pioglitazone (ACTOS) 45 MG tablet Take 45 mg by mouth daily.       Social History   Socioeconomic History  . Marital status: Single    Spouse name: Not on file  . Number of children: Not on file  . Years of education: Not on file  . Highest education level: Not on file  Occupational History  . Not on file  Social Needs  . Financial resource strain: Not on file  . Food insecurity:    Worry: Not on file    Inability: Not on file  . Transportation needs:    Medical: Not on file    Non-medical: Not on file  Tobacco Use  . Smoking status: Never Smoker  . Smokeless tobacco: Never Used  Substance  and Sexual Activity  . Alcohol use: No  . Drug use: No  . Sexual activity: Not on file  Lifestyle  . Physical activity:    Days per week: Not on file    Minutes per session: Not on file  . Stress: Not on file  Relationships  . Social connections:    Talks on phone: Not on file    Gets together: Not on file    Attends religious service: Not on file    Active member of club or organization: Not on file    Attends meetings of clubs or organizations: Not on file    Relationship status: Not on file  . Intimate partner violence:    Fear of current or ex partner: Not on file    Emotionally abused: Not on file    Physically abused: Not on file    Forced sexual activity: Not on file  Other Topics Concern  . Not on file    Social History Narrative  . Not on file   Family History  Problem Relation Age of Onset  . Hypertension Mother   . Diabetes Mother   . Cerebral aneurysm Mother   . Breast cancer Mother 45  . Cerebral aneurysm Father   . Breast cancer Maternal Aunt 30     OBJECTIVE:  Vitals:   08/16/17 1802  BP: (!) 154/51  Pulse: 68  Resp: 20  Temp: 98.6 F (37 C)  TempSrc: Oral  SpO2: 99%     General appearance: AOx3; appears fatigued HEENT: PERRL.  EOM grossly intact.  Sinuses nontender; mild clear rhinorrhea; tonsils nonerythematous, uvula midline Neck: supple without LAD Lungs: clear to auscultation bilaterally without adventitious breath sounds; persistent dry cough; nontender to AP compression Heart: Low grade systolic murmur.  Radial pulses 2+ symmetrical bilaterally Skin: warm and dry Psychological: alert and cooperative; normal mood and affect  DIAGNOSTIC STUDIES:  CLINICAL DATA: Chest congestion and dry cough for 3 weeks. Chest pain, lower back pain.  EXAM: CHEST - 2 VIEW  COMPARISON: 02/24/2014.  FINDINGS: Trachea is midline. Heart size normal. Thoracic aorta is calcified. Lungs are clear. No pleural fluid.  IMPRESSION: 1. No acute findings. 2. Aortic atherosclerosis (ICD10-170.0).   Electronically Signed By: Lorin Picket M.D. On: 08/16/2017 18:42  I have reviewed the x-rays myself and the radiologist interpretation. I am in agreement with the radiologist interpretation.    EKG:   NSR without obvious ST elevations or depression.  Nonspecific t-wave changes in septal leads.  Comparable to past EKG.  Reviewed with Dr. Mannie Stabile.    ASSESSMENT & PLAN:  1. Bronchitis   2. Atherosclerosis     Meds ordered this encounter  Medications  . albuterol (PROVENTIL HFA;VENTOLIN HFA) 108 (90 Base) MCG/ACT inhaler 2 puff  . benzonatate (TESSALON) 100 MG capsule    Sig: Take 1 capsule (100 mg total) by mouth every 8 (eight) hours as needed for cough.     Dispense:  21 capsule    Refill:  0    Order Specific Question:   Supervising Provider    Answer:   Wynona Luna [[id number]]   Inhaler given in office Get plenty of rest and push fluids Use OTC medication as needed for symptomatic relief Prescribed tessolone perles as needed for cough Follow up with PCP if symptoms next week for reevaluation of symptoms and for further evaluation of murmur and atherosclerosis  Return or go to ER if you have any new or worsening symptoms   Reviewed  expectations re: course of current medical issues. Questions answered. Outlined signs and symptoms indicating need for more acute intervention. Patient verbalized understanding. After Visit Summary given.          Lestine Box, PA-C 08/16/17 2006

## 2017-08-16 NOTE — Discharge Instructions (Addendum)
Inhaler given in office Get plenty of rest and push fluids Use OTC medication as needed for symptomatic relief Prescribed tessolone perles as needed for cough Follow up with PCP if symptoms next week for reevaluation of symptoms Return or go to ER if you have any new or worsening symptoms

## 2017-08-16 NOTE — ED Notes (Signed)
Pt discharged by provider.

## 2017-08-16 NOTE — ED Triage Notes (Signed)
Cough for 3 weeks.  Coughing up thick yellow phlegm.  Lower back pain and upper chest soreness.

## 2017-09-27 DIAGNOSIS — M8580 Other specified disorders of bone density and structure, unspecified site: Secondary | ICD-10-CM | POA: Diagnosis not present

## 2017-09-27 DIAGNOSIS — Z0000 Encounter for general adult medical examination without abnormal findings: Secondary | ICD-10-CM | POA: Diagnosis not present

## 2017-09-27 DIAGNOSIS — E559 Vitamin D deficiency, unspecified: Secondary | ICD-10-CM | POA: Diagnosis not present

## 2017-09-27 DIAGNOSIS — E1165 Type 2 diabetes mellitus with hyperglycemia: Secondary | ICD-10-CM | POA: Diagnosis not present

## 2017-09-27 DIAGNOSIS — E782 Mixed hyperlipidemia: Secondary | ICD-10-CM | POA: Diagnosis not present

## 2017-09-27 DIAGNOSIS — Z23 Encounter for immunization: Secondary | ICD-10-CM | POA: Diagnosis not present

## 2017-09-27 DIAGNOSIS — I10 Essential (primary) hypertension: Secondary | ICD-10-CM | POA: Diagnosis not present

## 2017-09-27 DIAGNOSIS — I700 Atherosclerosis of aorta: Secondary | ICD-10-CM | POA: Diagnosis not present

## 2017-09-27 DIAGNOSIS — R05 Cough: Secondary | ICD-10-CM | POA: Diagnosis not present

## 2017-09-27 DIAGNOSIS — Z794 Long term (current) use of insulin: Secondary | ICD-10-CM | POA: Diagnosis not present

## 2017-09-27 DIAGNOSIS — E042 Nontoxic multinodular goiter: Secondary | ICD-10-CM | POA: Diagnosis not present

## 2017-10-25 DIAGNOSIS — I10 Essential (primary) hypertension: Secondary | ICD-10-CM | POA: Diagnosis not present

## 2017-11-02 DIAGNOSIS — Z23 Encounter for immunization: Secondary | ICD-10-CM | POA: Diagnosis not present

## 2017-11-07 DIAGNOSIS — M8588 Other specified disorders of bone density and structure, other site: Secondary | ICD-10-CM | POA: Diagnosis not present

## 2018-01-16 ENCOUNTER — Emergency Department (HOSPITAL_COMMUNITY): Payer: Medicare Other

## 2018-01-16 ENCOUNTER — Emergency Department (HOSPITAL_COMMUNITY)
Admission: EM | Admit: 2018-01-16 | Discharge: 2018-01-16 | Disposition: A | Discharge status: Home / Self Care | Payer: Medicare Other | Attending: Emergency Medicine | Admitting: Emergency Medicine

## 2018-01-16 ENCOUNTER — Other Ambulatory Visit: Payer: Self-pay

## 2018-01-16 DIAGNOSIS — R109 Unspecified abdominal pain: Secondary | ICD-10-CM | POA: Diagnosis not present

## 2018-01-16 DIAGNOSIS — Z794 Long term (current) use of insulin: Secondary | ICD-10-CM | POA: Diagnosis not present

## 2018-01-16 DIAGNOSIS — K429 Umbilical hernia without obstruction or gangrene: Secondary | ICD-10-CM | POA: Diagnosis not present

## 2018-01-16 DIAGNOSIS — I10 Essential (primary) hypertension: Secondary | ICD-10-CM | POA: Diagnosis not present

## 2018-01-16 DIAGNOSIS — M79604 Pain in right leg: Secondary | ICD-10-CM | POA: Diagnosis not present

## 2018-01-16 DIAGNOSIS — S6991XA Unspecified injury of right wrist, hand and finger(s), initial encounter: Secondary | ICD-10-CM | POA: Diagnosis not present

## 2018-01-16 DIAGNOSIS — E119 Type 2 diabetes mellitus without complications: Secondary | ICD-10-CM | POA: Diagnosis not present

## 2018-01-16 DIAGNOSIS — Y9389 Activity, other specified: Secondary | ICD-10-CM | POA: Diagnosis not present

## 2018-01-16 DIAGNOSIS — S61411A Laceration without foreign body of right hand, initial encounter: Secondary | ICD-10-CM | POA: Insufficient documentation

## 2018-01-16 DIAGNOSIS — I959 Hypotension, unspecified: Secondary | ICD-10-CM | POA: Diagnosis not present

## 2018-01-16 DIAGNOSIS — M25571 Pain in right ankle and joints of right foot: Secondary | ICD-10-CM | POA: Insufficient documentation

## 2018-01-16 DIAGNOSIS — E041 Nontoxic single thyroid nodule: Secondary | ICD-10-CM

## 2018-01-16 DIAGNOSIS — Z79899 Other long term (current) drug therapy: Secondary | ICD-10-CM | POA: Diagnosis not present

## 2018-01-16 DIAGNOSIS — M79642 Pain in left hand: Secondary | ICD-10-CM | POA: Diagnosis not present

## 2018-01-16 DIAGNOSIS — R079 Chest pain, unspecified: Secondary | ICD-10-CM | POA: Diagnosis not present

## 2018-01-16 DIAGNOSIS — Y999 Unspecified external cause status: Secondary | ICD-10-CM | POA: Insufficient documentation

## 2018-01-16 DIAGNOSIS — S299XXA Unspecified injury of thorax, initial encounter: Secondary | ICD-10-CM | POA: Diagnosis not present

## 2018-01-16 DIAGNOSIS — M7989 Other specified soft tissue disorders: Secondary | ICD-10-CM | POA: Diagnosis not present

## 2018-01-16 DIAGNOSIS — S3991XA Unspecified injury of abdomen, initial encounter: Secondary | ICD-10-CM | POA: Diagnosis not present

## 2018-01-16 DIAGNOSIS — T07XXXA Unspecified multiple injuries, initial encounter: Secondary | ICD-10-CM | POA: Diagnosis not present

## 2018-01-16 DIAGNOSIS — Y92410 Unspecified street and highway as the place of occurrence of the external cause: Secondary | ICD-10-CM | POA: Insufficient documentation

## 2018-01-16 DIAGNOSIS — S20219A Contusion of unspecified front wall of thorax, initial encounter: Secondary | ICD-10-CM | POA: Diagnosis not present

## 2018-01-16 DIAGNOSIS — Z7982 Long term (current) use of aspirin: Secondary | ICD-10-CM | POA: Insufficient documentation

## 2018-01-16 DIAGNOSIS — S3993XA Unspecified injury of pelvis, initial encounter: Secondary | ICD-10-CM | POA: Diagnosis not present

## 2018-01-16 DIAGNOSIS — S0990XA Unspecified injury of head, initial encounter: Secondary | ICD-10-CM | POA: Diagnosis not present

## 2018-01-16 DIAGNOSIS — S199XXA Unspecified injury of neck, initial encounter: Secondary | ICD-10-CM | POA: Diagnosis not present

## 2018-01-16 DIAGNOSIS — R0789 Other chest pain: Secondary | ICD-10-CM | POA: Diagnosis not present

## 2018-01-16 DIAGNOSIS — S6992XA Unspecified injury of left wrist, hand and finger(s), initial encounter: Secondary | ICD-10-CM | POA: Diagnosis not present

## 2018-01-16 LAB — COMPREHENSIVE METABOLIC PANEL
ALBUMIN: 4.2 g/dL (ref 3.5–5.0)
ALT: 25 U/L (ref 0–44)
AST: 35 U/L (ref 15–41)
Alkaline Phosphatase: 96 U/L (ref 38–126)
Anion gap: 14 (ref 5–15)
BUN: 14 mg/dL (ref 8–23)
CHLORIDE: 102 mmol/L (ref 98–111)
CO2: 19 mmol/L — AB (ref 22–32)
CREATININE: 0.91 mg/dL (ref 0.44–1.00)
Calcium: 9.3 mg/dL (ref 8.9–10.3)
GFR calc Af Amer: >60 mL/min (ref >60)
GLUCOSE: 215 mg/dL — AB (ref 70–99)
Potassium: 4.4 mmol/L (ref 3.5–5.1)
Sodium: 135 mmol/L (ref 135–145)
Total Bilirubin: 1.5 mg/dL — ABNORMAL HIGH (ref 0.3–1.2)
Total Protein: 7.5 g/dL (ref 6.5–8.1)

## 2018-01-16 LAB — CBC
HEMATOCRIT: 45.8 % (ref 36.0–46.0)
HEMOGLOBIN: 14.7 g/dL (ref 12.0–15.0)
MCH: 30.1 pg (ref 26.0–34.0)
MCHC: 32.1 g/dL (ref 30.0–36.0)
MCV: 93.9 fL (ref 80.0–100.0)
Platelets: 273 10*3/uL (ref 150–400)
RBC: 4.88 MIL/uL (ref 3.87–5.11)
RDW: 12.6 % (ref 11.5–15.5)
WBC: 11.3 10*3/uL — AB (ref 4.0–10.5)
nRBC: 0 % (ref 0.0–0.2)

## 2018-01-16 LAB — I-STAT CG4 LACTIC ACID, ED: Lactic Acid, Venous: 1.88 mmol/L (ref 0.5–1.9)

## 2018-01-16 LAB — I-STAT TROPONIN, ED: TROPONIN I, POC: 0 ng/mL (ref 0.00–0.08)

## 2018-01-16 LAB — PROTIME-INR
INR: 1.05
Prothrombin Time: 13.6 seconds (ref 11.4–15.2)

## 2018-01-16 LAB — CDS SEROLOGY

## 2018-01-16 LAB — ETHANOL: Alcohol, Ethyl (B): <10 mg/dL (ref <10)

## 2018-01-16 MED ORDER — LIDOCAINE HCL (PF) 1 % IJ SOLN
10.0000 mL | Freq: Once | INTRAMUSCULAR | Status: AC
  Administered 2018-01-16: 10 mL
  Filled 2018-01-16: qty 10

## 2018-01-16 MED ORDER — IOHEXOL 300 MG/ML  SOLN
100.0000 mL | Freq: Once | INTRAMUSCULAR | Status: AC | PRN
  Administered 2018-01-16: 100 mL via INTRAVENOUS

## 2018-01-16 MED ORDER — OXYCODONE-ACETAMINOPHEN 5-325 MG PO TABS
1.0000 | ORAL_TABLET | Freq: Once | ORAL | Status: AC
  Administered 2018-01-16: 1 via ORAL
  Filled 2018-01-16: qty 1

## 2018-01-16 MED ORDER — CEPHALEXIN 500 MG PO CAPS: 500.0000 mg | ORAL_CAPSULE | Freq: Four times a day (QID) | ORAL | 0 refills | Status: DC

## 2018-01-16 MED ORDER — HYDROCODONE-ACETAMINOPHEN 5-325 MG PO TABS: 1.0000 | ORAL_TABLET | Freq: Four times a day (QID) | ORAL | 0 refills | Status: DC | PRN

## 2018-01-16 MED ORDER — CEPHALEXIN 250 MG PO CAPS
500.0000 mg | ORAL_CAPSULE | Freq: Once | ORAL | Status: AC
  Administered 2018-01-16: 500 mg via ORAL
  Filled 2018-01-16: qty 2
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
## 2018-01-16 NOTE — Discharge Instructions (Signed)
Keep your wound clean and dry for the first 24 hours.  After that wash it with soap and water gently once a daily and use antibiotic ointment.  Take your antibiotics as directed.  You need to follow-up with your primary care doctor within the next 2 to 3 days.  Incidentally on the CT scans you have a nodule in your thyroid gland and an umbilical hernia that can be followed by your primary care doctor.  Return to the emergency room if you have worsening symptoms.  Your sutures need to be removed in about 10 days.

## 2018-01-16 NOTE — ED Notes (Signed)
Placed purewick for u/a

## 2018-01-25 DIAGNOSIS — E041 Nontoxic single thyroid nodule: Secondary | ICD-10-CM | POA: Diagnosis not present

## 2018-01-25 DIAGNOSIS — S61419A Laceration without foreign body of unspecified hand, initial encounter: Secondary | ICD-10-CM | POA: Diagnosis not present

## 2018-01-25 DIAGNOSIS — S20212A Contusion of left front wall of thorax, initial encounter: Secondary | ICD-10-CM | POA: Diagnosis not present

## 2018-01-25 DIAGNOSIS — E1165 Type 2 diabetes mellitus with hyperglycemia: Secondary | ICD-10-CM | POA: Diagnosis not present

## 2018-01-29 ENCOUNTER — Other Ambulatory Visit: Payer: Self-pay | Admitting: Family Medicine

## 2018-01-29 DIAGNOSIS — S20219A Contusion of unspecified front wall of thorax, initial encounter: Secondary | ICD-10-CM | POA: Diagnosis not present

## 2018-01-29 DIAGNOSIS — E041 Nontoxic single thyroid nodule: Secondary | ICD-10-CM

## 2018-01-29 DIAGNOSIS — S61419A Laceration without foreign body of unspecified hand, initial encounter: Secondary | ICD-10-CM | POA: Diagnosis not present

## 2018-01-29 DIAGNOSIS — E1165 Type 2 diabetes mellitus with hyperglycemia: Secondary | ICD-10-CM | POA: Diagnosis not present

## 2018-02-13 DIAGNOSIS — H2513 Age-related nuclear cataract, bilateral: Secondary | ICD-10-CM | POA: Diagnosis not present

## 2018-02-13 DIAGNOSIS — E119 Type 2 diabetes mellitus without complications: Secondary | ICD-10-CM | POA: Diagnosis not present

## 2018-02-13 DIAGNOSIS — H35033 Hypertensive retinopathy, bilateral: Secondary | ICD-10-CM | POA: Diagnosis not present

## 2018-02-15 DIAGNOSIS — K921 Melena: Secondary | ICD-10-CM | POA: Diagnosis not present

## 2018-02-15 DIAGNOSIS — M533 Sacrococcygeal disorders, not elsewhere classified: Secondary | ICD-10-CM | POA: Diagnosis not present

## 2018-02-15 DIAGNOSIS — R197 Diarrhea, unspecified: Secondary | ICD-10-CM | POA: Diagnosis not present

## 2018-02-16 ENCOUNTER — Other Ambulatory Visit: Payer: Self-pay | Admitting: Family Medicine

## 2018-02-16 DIAGNOSIS — M533 Sacrococcygeal disorders, not elsewhere classified: Secondary | ICD-10-CM

## 2018-02-19 DIAGNOSIS — R197 Diarrhea, unspecified: Secondary | ICD-10-CM | POA: Diagnosis not present

## 2018-02-19 DIAGNOSIS — K921 Melena: Secondary | ICD-10-CM | POA: Diagnosis not present

## 2018-02-21 ENCOUNTER — Ambulatory Visit
Admission: RE | Admit: 2018-02-21 | Discharge: 2018-02-21 | Disposition: A | Discharge status: Home / Self Care | Payer: Medicare Other | Source: Ambulatory Visit | Attending: Family Medicine | Admitting: Family Medicine

## 2018-02-21 DIAGNOSIS — E041 Nontoxic single thyroid nodule: Secondary | ICD-10-CM

## 2018-02-21 DIAGNOSIS — M533 Sacrococcygeal disorders, not elsewhere classified: Secondary | ICD-10-CM

## 2018-02-23 DIAGNOSIS — E041 Nontoxic single thyroid nodule: Secondary | ICD-10-CM | POA: Diagnosis not present

## 2018-02-23 DIAGNOSIS — S8011XD Contusion of right lower leg, subsequent encounter: Secondary | ICD-10-CM | POA: Diagnosis not present

## 2018-03-02 DIAGNOSIS — M8580 Other specified disorders of bone density and structure, unspecified site: Secondary | ICD-10-CM | POA: Diagnosis not present

## 2018-03-02 DIAGNOSIS — Z794 Long term (current) use of insulin: Secondary | ICD-10-CM | POA: Diagnosis not present

## 2018-03-02 DIAGNOSIS — E042 Nontoxic multinodular goiter: Secondary | ICD-10-CM | POA: Diagnosis not present

## 2018-03-02 DIAGNOSIS — E1165 Type 2 diabetes mellitus with hyperglycemia: Secondary | ICD-10-CM | POA: Diagnosis not present

## 2018-03-09 DIAGNOSIS — S8011XD Contusion of right lower leg, subsequent encounter: Secondary | ICD-10-CM | POA: Diagnosis not present

## 2018-04-25 DIAGNOSIS — J209 Acute bronchitis, unspecified: Secondary | ICD-10-CM | POA: Diagnosis not present

## 2018-04-25 DIAGNOSIS — I10 Essential (primary) hypertension: Secondary | ICD-10-CM | POA: Diagnosis not present

## 2018-07-10 DIAGNOSIS — E119 Type 2 diabetes mellitus without complications: Secondary | ICD-10-CM | POA: Diagnosis not present

## 2018-07-10 DIAGNOSIS — J0100 Acute maxillary sinusitis, unspecified: Secondary | ICD-10-CM | POA: Diagnosis not present

## 2018-07-10 DIAGNOSIS — R748 Abnormal levels of other serum enzymes: Secondary | ICD-10-CM | POA: Diagnosis not present

## 2018-07-10 DIAGNOSIS — I10 Essential (primary) hypertension: Secondary | ICD-10-CM | POA: Diagnosis not present

## 2018-08-13 ENCOUNTER — Other Ambulatory Visit: Payer: Self-pay | Admitting: Family Medicine

## 2018-08-13 ENCOUNTER — Other Ambulatory Visit: Payer: Self-pay

## 2018-08-13 ENCOUNTER — Ambulatory Visit
Admission: RE | Admit: 2018-08-13 | Discharge: 2018-08-13 | Disposition: A | Discharge status: Home / Self Care | Payer: Medicare Other | Source: Ambulatory Visit | Attending: Family Medicine | Admitting: Family Medicine

## 2018-08-13 DIAGNOSIS — H9203 Otalgia, bilateral: Secondary | ICD-10-CM | POA: Diagnosis not present

## 2018-08-13 DIAGNOSIS — J329 Chronic sinusitis, unspecified: Secondary | ICD-10-CM | POA: Diagnosis not present

## 2018-08-13 DIAGNOSIS — J3489 Other specified disorders of nose and nasal sinuses: Secondary | ICD-10-CM

## 2018-10-01 DIAGNOSIS — E1165 Type 2 diabetes mellitus with hyperglycemia: Secondary | ICD-10-CM | POA: Diagnosis not present

## 2018-10-01 DIAGNOSIS — Z794 Long term (current) use of insulin: Secondary | ICD-10-CM | POA: Diagnosis not present

## 2018-10-01 DIAGNOSIS — E042 Nontoxic multinodular goiter: Secondary | ICD-10-CM | POA: Diagnosis not present

## 2018-10-01 DIAGNOSIS — M8580 Other specified disorders of bone density and structure, unspecified site: Secondary | ICD-10-CM | POA: Diagnosis not present

## 2018-10-11 DIAGNOSIS — E042 Nontoxic multinodular goiter: Secondary | ICD-10-CM | POA: Diagnosis not present

## 2018-10-11 DIAGNOSIS — E1165 Type 2 diabetes mellitus with hyperglycemia: Secondary | ICD-10-CM | POA: Diagnosis not present

## 2018-10-11 DIAGNOSIS — M8580 Other specified disorders of bone density and structure, unspecified site: Secondary | ICD-10-CM | POA: Diagnosis not present

## 2018-11-07 DIAGNOSIS — I700 Atherosclerosis of aorta: Secondary | ICD-10-CM | POA: Diagnosis not present

## 2018-11-07 DIAGNOSIS — I10 Essential (primary) hypertension: Secondary | ICD-10-CM | POA: Diagnosis not present

## 2018-11-07 DIAGNOSIS — Z0001 Encounter for general adult medical examination with abnormal findings: Secondary | ICD-10-CM | POA: Diagnosis not present

## 2018-11-07 DIAGNOSIS — Z23 Encounter for immunization: Secondary | ICD-10-CM | POA: Diagnosis not present

## 2018-12-17 ENCOUNTER — Other Ambulatory Visit: Payer: Self-pay | Admitting: Family Medicine

## 2018-12-17 DIAGNOSIS — Z1231 Encounter for screening mammogram for malignant neoplasm of breast: Secondary | ICD-10-CM

## 2018-12-18 ENCOUNTER — Other Ambulatory Visit: Payer: Self-pay

## 2018-12-18 ENCOUNTER — Ambulatory Visit
Admission: RE | Admit: 2018-12-18 | Discharge: 2018-12-18 | Disposition: A | Discharge status: Home / Self Care | Payer: Medicare Other | Source: Ambulatory Visit | Attending: Family Medicine | Admitting: Family Medicine

## 2018-12-18 DIAGNOSIS — Z1231 Encounter for screening mammogram for malignant neoplasm of breast: Secondary | ICD-10-CM

## 2019-02-06 ENCOUNTER — Ambulatory Visit: Payer: Medicare Other | Attending: Internal Medicine

## 2019-02-06 DIAGNOSIS — Z20822 Contact with and (suspected) exposure to covid-19: Secondary | ICD-10-CM

## 2019-02-08 LAB — NOVEL CORONAVIRUS, NAA: SARS-CoV-2, NAA: DETECTED — AB
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
## 2019-09-06 ENCOUNTER — Other Ambulatory Visit: Payer: Self-pay | Admitting: Internal Medicine

## 2019-09-06 DIAGNOSIS — E1165 Type 2 diabetes mellitus with hyperglycemia: Secondary | ICD-10-CM | POA: Diagnosis not present

## 2019-09-06 DIAGNOSIS — M8580 Other specified disorders of bone density and structure, unspecified site: Secondary | ICD-10-CM | POA: Diagnosis not present

## 2019-09-06 DIAGNOSIS — Z794 Long term (current) use of insulin: Secondary | ICD-10-CM | POA: Diagnosis not present

## 2019-09-06 DIAGNOSIS — E042 Nontoxic multinodular goiter: Secondary | ICD-10-CM

## 2019-09-16 ENCOUNTER — Ambulatory Visit
Admission: RE | Admit: 2019-09-16 | Discharge: 2019-09-16 | Disposition: A | Discharge status: Home / Self Care | Payer: Medicare Other | Source: Ambulatory Visit | Attending: Internal Medicine | Admitting: Internal Medicine

## 2019-09-16 DIAGNOSIS — E042 Nontoxic multinodular goiter: Secondary | ICD-10-CM

## 2019-09-16 DIAGNOSIS — E041 Nontoxic single thyroid nodule: Secondary | ICD-10-CM | POA: Diagnosis not present

## 2019-11-22 DIAGNOSIS — Z23 Encounter for immunization: Secondary | ICD-10-CM | POA: Diagnosis not present
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
## 2020-01-08 DIAGNOSIS — Z794 Long term (current) use of insulin: Secondary | ICD-10-CM | POA: Diagnosis not present

## 2020-01-08 DIAGNOSIS — E042 Nontoxic multinodular goiter: Secondary | ICD-10-CM | POA: Diagnosis not present

## 2020-01-08 DIAGNOSIS — M8580 Other specified disorders of bone density and structure, unspecified site: Secondary | ICD-10-CM | POA: Diagnosis not present

## 2020-01-08 DIAGNOSIS — E1165 Type 2 diabetes mellitus with hyperglycemia: Secondary | ICD-10-CM | POA: Diagnosis not present

## 2020-01-22 DIAGNOSIS — E1165 Type 2 diabetes mellitus with hyperglycemia: Secondary | ICD-10-CM | POA: Diagnosis not present

## 2020-02-06 DIAGNOSIS — Z03818 Encounter for observation for suspected exposure to other biological agents ruled out: Secondary | ICD-10-CM | POA: Diagnosis not present

## 2020-05-11 DIAGNOSIS — E1165 Type 2 diabetes mellitus with hyperglycemia: Secondary | ICD-10-CM | POA: Diagnosis not present

## 2020-05-11 DIAGNOSIS — E042 Nontoxic multinodular goiter: Secondary | ICD-10-CM | POA: Diagnosis not present

## 2020-05-11 DIAGNOSIS — Z794 Long term (current) use of insulin: Secondary | ICD-10-CM | POA: Diagnosis not present

## 2020-05-11 DIAGNOSIS — M8580 Other specified disorders of bone density and structure, unspecified site: Secondary | ICD-10-CM | POA: Diagnosis not present

## 2020-05-19 ENCOUNTER — Other Ambulatory Visit: Payer: Self-pay | Admitting: Family Medicine

## 2020-05-19 DIAGNOSIS — Z1231 Encounter for screening mammogram for malignant neoplasm of breast: Secondary | ICD-10-CM

## 2020-05-21 ENCOUNTER — Other Ambulatory Visit: Payer: Self-pay | Admitting: Family Medicine

## 2020-05-21 ENCOUNTER — Ambulatory Visit
Admission: RE | Admit: 2020-05-21 | Discharge: 2020-05-21 | Disposition: A | Discharge status: Home / Self Care | Payer: Medicare Other | Source: Ambulatory Visit | Attending: Family Medicine | Admitting: Family Medicine

## 2020-05-21 ENCOUNTER — Other Ambulatory Visit: Payer: Self-pay

## 2020-05-21 DIAGNOSIS — Z1231 Encounter for screening mammogram for malignant neoplasm of breast: Secondary | ICD-10-CM

## 2020-05-21 DIAGNOSIS — N644 Mastodynia: Secondary | ICD-10-CM

## 2020-06-12 ENCOUNTER — Ambulatory Visit: Payer: Medicare Other

## 2020-06-12 ENCOUNTER — Other Ambulatory Visit: Payer: Self-pay

## 2020-06-12 ENCOUNTER — Ambulatory Visit
Admission: RE | Admit: 2020-06-12 | Discharge: 2020-06-12 | Disposition: A | Discharge status: Home / Self Care | Payer: Medicare Other | Source: Ambulatory Visit | Attending: Family Medicine | Admitting: Family Medicine

## 2020-06-12 DIAGNOSIS — N644 Mastodynia: Secondary | ICD-10-CM

## 2020-06-12 DIAGNOSIS — R922 Inconclusive mammogram: Secondary | ICD-10-CM | POA: Diagnosis not present

## 2020-08-14 DIAGNOSIS — Z7984 Long term (current) use of oral hypoglycemic drugs: Secondary | ICD-10-CM | POA: Diagnosis not present

## 2020-08-14 DIAGNOSIS — Z794 Long term (current) use of insulin: Secondary | ICD-10-CM | POA: Diagnosis not present

## 2020-08-14 DIAGNOSIS — M8580 Other specified disorders of bone density and structure, unspecified site: Secondary | ICD-10-CM | POA: Diagnosis not present

## 2020-08-14 DIAGNOSIS — E1165 Type 2 diabetes mellitus with hyperglycemia: Secondary | ICD-10-CM | POA: Diagnosis not present

## 2020-08-14 DIAGNOSIS — E042 Nontoxic multinodular goiter: Secondary | ICD-10-CM | POA: Diagnosis not present

## 2020-11-20 DIAGNOSIS — Z23 Encounter for immunization: Secondary | ICD-10-CM | POA: Diagnosis not present

## 2021-01-13 ENCOUNTER — Encounter (INDEPENDENT_AMBULATORY_CARE_PROVIDER_SITE_OTHER): Payer: Medicare Other | Admitting: Ophthalmology

## 2021-01-13 ENCOUNTER — Other Ambulatory Visit: Payer: Self-pay

## 2021-01-13 DIAGNOSIS — I10 Essential (primary) hypertension: Secondary | ICD-10-CM | POA: Diagnosis not present

## 2021-01-13 DIAGNOSIS — H35033 Hypertensive retinopathy, bilateral: Secondary | ICD-10-CM | POA: Diagnosis not present

## 2021-01-13 DIAGNOSIS — E113292 Type 2 diabetes mellitus with mild nonproliferative diabetic retinopathy without macular edema, left eye: Secondary | ICD-10-CM | POA: Diagnosis not present

## 2021-01-13 DIAGNOSIS — D3131 Benign neoplasm of right choroid: Secondary | ICD-10-CM

## 2021-01-13 DIAGNOSIS — H43813 Vitreous degeneration, bilateral: Secondary | ICD-10-CM

## 2021-01-13 DIAGNOSIS — E113391 Type 2 diabetes mellitus with moderate nonproliferative diabetic retinopathy without macular edema, right eye: Secondary | ICD-10-CM

## 2021-03-01 DIAGNOSIS — Z79899 Other long term (current) drug therapy: Secondary | ICD-10-CM | POA: Diagnosis not present

## 2021-03-01 DIAGNOSIS — E7800 Pure hypercholesterolemia, unspecified: Secondary | ICD-10-CM | POA: Diagnosis not present

## 2021-03-01 DIAGNOSIS — E1169 Type 2 diabetes mellitus with other specified complication: Secondary | ICD-10-CM | POA: Diagnosis not present

## 2021-03-01 DIAGNOSIS — I10 Essential (primary) hypertension: Secondary | ICD-10-CM | POA: Diagnosis not present

## 2021-03-01 DIAGNOSIS — Z0001 Encounter for general adult medical examination with abnormal findings: Secondary | ICD-10-CM | POA: Diagnosis not present

## 2021-03-24 DIAGNOSIS — I10 Essential (primary) hypertension: Secondary | ICD-10-CM | POA: Diagnosis not present

## 2021-03-24 DIAGNOSIS — Z79899 Other long term (current) drug therapy: Secondary | ICD-10-CM | POA: Diagnosis not present

## 2021-04-14 DIAGNOSIS — E1165 Type 2 diabetes mellitus with hyperglycemia: Secondary | ICD-10-CM | POA: Diagnosis not present

## 2021-04-14 DIAGNOSIS — E042 Nontoxic multinodular goiter: Secondary | ICD-10-CM | POA: Diagnosis not present

## 2021-04-14 DIAGNOSIS — M8580 Other specified disorders of bone density and structure, unspecified site: Secondary | ICD-10-CM | POA: Diagnosis not present

## 2021-04-14 DIAGNOSIS — Z794 Long term (current) use of insulin: Secondary | ICD-10-CM | POA: Diagnosis not present

## 2021-06-01 DIAGNOSIS — E1165 Type 2 diabetes mellitus with hyperglycemia: Secondary | ICD-10-CM | POA: Diagnosis not present

## 2021-06-01 DIAGNOSIS — R748 Abnormal levels of other serum enzymes: Secondary | ICD-10-CM | POA: Diagnosis not present

## 2021-07-09 DIAGNOSIS — R945 Abnormal results of liver function studies: Secondary | ICD-10-CM | POA: Diagnosis not present

## 2021-07-09 DIAGNOSIS — R748 Abnormal levels of other serum enzymes: Secondary | ICD-10-CM | POA: Diagnosis not present

## 2021-07-21 DIAGNOSIS — K648 Other hemorrhoids: Secondary | ICD-10-CM | POA: Diagnosis not present

## 2021-07-21 DIAGNOSIS — D124 Benign neoplasm of descending colon: Secondary | ICD-10-CM | POA: Diagnosis not present

## 2021-07-21 DIAGNOSIS — Z86010 Personal history of colonic polyps: Secondary | ICD-10-CM | POA: Diagnosis not present

## 2021-07-21 DIAGNOSIS — D122 Benign neoplasm of ascending colon: Secondary | ICD-10-CM | POA: Diagnosis not present

## 2021-07-21 DIAGNOSIS — D125 Benign neoplasm of sigmoid colon: Secondary | ICD-10-CM | POA: Diagnosis not present

## 2021-07-21 DIAGNOSIS — Z800 Family history of malignant neoplasm of digestive organs: Secondary | ICD-10-CM | POA: Diagnosis not present

## 2021-07-21 DIAGNOSIS — Z09 Encounter for follow-up examination after completed treatment for conditions other than malignant neoplasm: Secondary | ICD-10-CM | POA: Diagnosis not present

## 2021-07-23 DIAGNOSIS — D125 Benign neoplasm of sigmoid colon: Secondary | ICD-10-CM | POA: Diagnosis not present

## 2021-08-09 ENCOUNTER — Encounter (INDEPENDENT_AMBULATORY_CARE_PROVIDER_SITE_OTHER): Payer: Medicare Other | Admitting: Ophthalmology

## 2021-08-09 DIAGNOSIS — I10 Essential (primary) hypertension: Secondary | ICD-10-CM

## 2021-08-09 DIAGNOSIS — E113293 Type 2 diabetes mellitus with mild nonproliferative diabetic retinopathy without macular edema, bilateral: Secondary | ICD-10-CM

## 2021-08-09 DIAGNOSIS — H43813 Vitreous degeneration, bilateral: Secondary | ICD-10-CM

## 2021-08-09 DIAGNOSIS — H35033 Hypertensive retinopathy, bilateral: Secondary | ICD-10-CM | POA: Diagnosis not present

## 2021-08-09 DIAGNOSIS — D3131 Benign neoplasm of right choroid: Secondary | ICD-10-CM | POA: Diagnosis not present

## 2021-09-20 DIAGNOSIS — E113293 Type 2 diabetes mellitus with mild nonproliferative diabetic retinopathy without macular edema, bilateral: Secondary | ICD-10-CM | POA: Diagnosis not present

## 2021-09-20 DIAGNOSIS — H2513 Age-related nuclear cataract, bilateral: Secondary | ICD-10-CM | POA: Diagnosis not present

## 2021-09-20 DIAGNOSIS — H35033 Hypertensive retinopathy, bilateral: Secondary | ICD-10-CM | POA: Diagnosis not present

## 2021-10-13 DIAGNOSIS — I10 Essential (primary) hypertension: Secondary | ICD-10-CM | POA: Diagnosis not present

## 2021-10-13 DIAGNOSIS — Z79899 Other long term (current) drug therapy: Secondary | ICD-10-CM | POA: Diagnosis not present

## 2021-10-13 DIAGNOSIS — Z23 Encounter for immunization: Secondary | ICD-10-CM | POA: Diagnosis not present

## 2021-10-13 DIAGNOSIS — E1169 Type 2 diabetes mellitus with other specified complication: Secondary | ICD-10-CM | POA: Diagnosis not present

## 2021-10-13 DIAGNOSIS — E7800 Pure hypercholesterolemia, unspecified: Secondary | ICD-10-CM | POA: Diagnosis not present

## 2021-10-25 DIAGNOSIS — H2513 Age-related nuclear cataract, bilateral: Secondary | ICD-10-CM | POA: Diagnosis not present

## 2021-10-25 DIAGNOSIS — H2512 Age-related nuclear cataract, left eye: Secondary | ICD-10-CM | POA: Diagnosis not present

## 2021-11-09 DIAGNOSIS — H269 Unspecified cataract: Secondary | ICD-10-CM | POA: Diagnosis not present

## 2021-11-09 DIAGNOSIS — H2512 Age-related nuclear cataract, left eye: Secondary | ICD-10-CM | POA: Diagnosis not present

## 2022-01-13 ENCOUNTER — Encounter (INDEPENDENT_AMBULATORY_CARE_PROVIDER_SITE_OTHER): Payer: Medicare Other | Admitting: Ophthalmology

## 2022-03-09 DIAGNOSIS — E1165 Type 2 diabetes mellitus with hyperglycemia: Secondary | ICD-10-CM | POA: Diagnosis not present

## 2022-03-09 DIAGNOSIS — Z0001 Encounter for general adult medical examination with abnormal findings: Secondary | ICD-10-CM | POA: Diagnosis not present

## 2022-03-09 DIAGNOSIS — Z79899 Other long term (current) drug therapy: Secondary | ICD-10-CM | POA: Diagnosis not present

## 2022-03-09 DIAGNOSIS — E7800 Pure hypercholesterolemia, unspecified: Secondary | ICD-10-CM | POA: Diagnosis not present

## 2022-04-06 ENCOUNTER — Ambulatory Visit (INDEPENDENT_AMBULATORY_CARE_PROVIDER_SITE_OTHER): Payer: Medicare Other

## 2022-04-06 ENCOUNTER — Ambulatory Visit (HOSPITAL_COMMUNITY)
Admission: EM | Admit: 2022-04-06 | Discharge: 2022-04-06 | Disposition: A | Discharge status: Home / Self Care | Payer: Medicare Other | Attending: Family Medicine | Admitting: Family Medicine

## 2022-04-06 ENCOUNTER — Encounter (HOSPITAL_COMMUNITY): Payer: Self-pay

## 2022-04-06 DIAGNOSIS — J0110 Acute frontal sinusitis, unspecified: Secondary | ICD-10-CM | POA: Diagnosis not present

## 2022-04-06 DIAGNOSIS — I10 Essential (primary) hypertension: Secondary | ICD-10-CM

## 2022-04-06 DIAGNOSIS — R059 Cough, unspecified: Secondary | ICD-10-CM | POA: Diagnosis not present

## 2022-04-06 DIAGNOSIS — Z1152 Encounter for screening for COVID-19: Secondary | ICD-10-CM | POA: Diagnosis not present

## 2022-04-06 DIAGNOSIS — K219 Gastro-esophageal reflux disease without esophagitis: Secondary | ICD-10-CM | POA: Insufficient documentation

## 2022-04-06 DIAGNOSIS — J3489 Other specified disorders of nose and nasal sinuses: Secondary | ICD-10-CM | POA: Insufficient documentation

## 2022-04-06 DIAGNOSIS — M545 Low back pain, unspecified: Secondary | ICD-10-CM | POA: Diagnosis not present

## 2022-04-06 DIAGNOSIS — R053 Chronic cough: Secondary | ICD-10-CM

## 2022-04-06 DIAGNOSIS — E119 Type 2 diabetes mellitus without complications: Secondary | ICD-10-CM | POA: Diagnosis not present

## 2022-04-06 DIAGNOSIS — R509 Fever, unspecified: Secondary | ICD-10-CM

## 2022-04-06 MED ORDER — HYDROCODONE BIT-HOMATROP MBR 5-1.5 MG/5ML PO SOLN: 5.0000 mL | Freq: Four times a day (QID) | ORAL | 0 refills | Status: DC | PRN

## 2022-04-06 MED ORDER — CEFDINIR 300 MG PO CAPS: 300.0000 mg | ORAL_CAPSULE | Freq: Two times a day (BID) | ORAL | 0 refills | Status: DC

## 2022-04-06 NOTE — ED Triage Notes (Signed)
Pt is here for lower back pain, bilateral ear pain, neck pain,body aches chills , low energy, cough, nasal drainage and nasal congestion on and off x 2 months . Pt has been having fever on and off x few days .

## 2022-04-06 NOTE — Discharge Instructions (Addendum)
Your blood pressure was noted to be elevated during your visit today. If you are currently taking medication for high blood pressure, please ensure you are taking this as directed. If you do not have a history of high blood pressure and your blood pressure remains persistently elevated, you may need to begin taking a medication at some point. You may return here within the next few days to recheck if unable to see your primary care provider or if you do not have a one.  BP (!) 160/81 (BP Location: Left Arm)   Pulse 62   Temp 99.8 F (37.7 C) (Oral)   Resp 16   SpO2 95%   BP Readings from Last 3 Encounters:  04/06/22 (!) 160/81  01/16/18 (!) 123/58  08/16/17 (!) 154/51   Your chest x-ray appeared normal.  Be aware, your cough medication may cause drowsiness. Please do not drive, operate heavy machinery or make important decisions while on this medication, it can cloud your judgement.

## 2022-04-07 LAB — SARS CORONAVIRUS 2 (TAT 6-24 HRS): SARS Coronavirus 2: NEGATIVE

## 2022-04-07 NOTE — ED Provider Notes (Signed)
[facility]   [id number] 04/06/22 Arrival Time: [id number]  ASSESSMENT & PLAN:  1. Acute non-recurrent frontal sinusitis   2. Persistent cough for 3 weeks or longer   3. Elevated blood pressure reading in office with diagnosis of hypertension    I have personally viewed the imaging studies ordered this visit. No acute changes on CXR. No signs of PNA.  COVID testing negative.  Meds ordered this encounter  Medications   cefdinir (OMNICEF) 300 MG capsule    Sig: Take 1 capsule (300 mg total) by mouth 2 (two) times daily.    Dispense:  20 capsule    Refill:  0   HYDROcodone bit-homatropine (HYCODAN) 5-1.5 MG/5ML syrup    Sig: Take 5 mLs by mouth every 6 (six) hours as needed for cough.    Dispense:  90 mL    Refill:  0   No s/s of HTN urgency. Discussed typical duration of symptoms. OTC symptom care as needed. Ensure adequate fluid intake and rest.    Discharge Instructions      Your blood pressure was noted to be elevated during your visit today. If you are currently taking medication for high blood pressure, please ensure you are taking this as directed. If you do not have a history of high blood pressure and your blood pressure remains persistently elevated, you may need to begin taking a medication at some point. You may return here within the next few days to recheck if unable to see your primary care provider or if you do not have a one.  BP (!) 160/81 (BP Location: Left Arm)   Pulse 62   Temp 99.8 F (37.7 C) (Oral)   Resp 16   SpO2 95%   BP Readings from Last 3 Encounters:  04/06/22 (!) 160/81  01/16/18 (!) 123/58  08/16/17 (!) 154/51   Your chest x-ray appeared normal.  Be aware, your cough medication may cause drowsiness. Please do not drive, operate heavy machinery or make important decisions while on this medication, it can cloud your judgement.     Results for orders placed or performed during the hospital encounter of 04/06/22  SARS  CORONAVIRUS 2 (TAT 6-24 HRS) Anterior Nasal Swab   Specimen: Anterior Nasal Swab  Result Value Ref Range   SARS Coronavirus 2 NEGATIVE NEGATIVE      Follow-up Information     Koirala, Dibas, MD.   Specialty: Family Medicine Why: If worsening or failing to improve as anticipated. Contact information: [place] [phone]  Also to f/u on BP.               Reviewed expectations re: course of current medical issues. Questions answered. Outlined signs and symptoms indicating need for more acute intervention. Patient verbalized understanding. After Visit Summary given.   SUBJECTIVE: History from: patient.  Ashley Keith is a 72 y.o. female who presents with complaint of nasal congestion, post-nasal drainage, and sinus pain. Onset gradual,  couple of weeks; ques longer . Respiratory symptoms: persistent dry cough x 3 weeks. Fever: absent. Overall normal PO intake without n/v. OTC treatment: none reported. Also with mild bilateral mid to lower back pain; ques related to coughing. Seasonal allergies: no. History of frequent sinus infections: no. No specific aggravating or alleviating factors reported. Social History   Tobacco Use  Smoking Status Never  Smokeless Tobacco Never   Increased blood pressure noted today. Reports that she is treated for HTN.  She reports  taking medications as instructed, no chest pain on exertion, no dyspnea on exertion, no swelling of ankles, no orthostatic dizziness or lightheadedness, no orthopnea or paroxysmal nocturnal dyspnea, and no palpitations.  Social History   Tobacco Use  Smoking Status Never  Smokeless Tobacco Never    OBJECTIVE:  Vitals:   04/06/22 1050  BP: (!) 160/81  Pulse: 62  Resp: 16  Temp: 99.8 F (37.7 C)  TempSrc: Oral  SpO2: 95%     General appearance: alert; no distress HEENT: nasal congestion; clear runny nose; throat irritation secondary to post-nasal  drainage; frontal tenderness to palpation; turbinates boggy Neck: supple without LAD; trachea midline Lungs: unlabored respirations, symmetrical air entry; cough: mild and dry; no respiratory distress Skin: warm and dry Psychological: alert and cooperative; normal mood and affect  No Known Allergies  Past Medical History:  Diagnosis Date   Arthritis    Depression    Diabetes mellitus    GERD (gastroesophageal reflux disease)    Hypertension    Obesity    Family History  Problem Relation Age of Onset   Hypertension Mother    Diabetes Mother    Cerebral aneurysm Mother    Breast cancer Mother 86   Cerebral aneurysm Father    Breast cancer Maternal Aunt 30   Social History   Socioeconomic History   Marital status: Single    Spouse name: Not on file   Number of children: Not on file   Years of education: Not on file   Highest education level: Not on file  Occupational History   Not on file  Tobacco Use   Smoking status: Never   Smokeless tobacco: Never  Substance and Sexual Activity   Alcohol use: No   Drug use: No   Sexual activity: Not on file  Other Topics Concern   Not on file  Social History Narrative   Not on file   Social Determinants of Health   Financial Resource Strain: Not on file  Food Insecurity: Not on file  Transportation Needs: Not on file  Physical Activity: Not on file  Stress: Not on file  Social Connections: Not on file  Intimate Partner Violence: Not on file             Vanessa Kick, MD 04/07/22 [id number]

## 2022-04-22 ENCOUNTER — Emergency Department (HOSPITAL_COMMUNITY): Payer: Medicare Other

## 2022-04-22 ENCOUNTER — Emergency Department (HOSPITAL_COMMUNITY)
Admission: EM | Admit: 2022-04-22 | Discharge: 2022-04-22 | Disposition: A | Discharge status: Home / Self Care | Payer: Medicare Other | Attending: Emergency Medicine | Admitting: Emergency Medicine

## 2022-04-22 ENCOUNTER — Encounter (HOSPITAL_COMMUNITY): Payer: Self-pay

## 2022-04-22 ENCOUNTER — Other Ambulatory Visit: Payer: Self-pay

## 2022-04-22 DIAGNOSIS — R079 Chest pain, unspecified: Secondary | ICD-10-CM | POA: Diagnosis not present

## 2022-04-22 DIAGNOSIS — W010XXA Fall on same level from slipping, tripping and stumbling without subsequent striking against object, initial encounter: Secondary | ICD-10-CM | POA: Insufficient documentation

## 2022-04-22 DIAGNOSIS — R739 Hyperglycemia, unspecified: Secondary | ICD-10-CM | POA: Diagnosis not present

## 2022-04-22 DIAGNOSIS — S80919A Unspecified superficial injury of unspecified knee, initial encounter: Secondary | ICD-10-CM | POA: Diagnosis not present

## 2022-04-22 DIAGNOSIS — S20212A Contusion of left front wall of thorax, initial encounter: Secondary | ICD-10-CM | POA: Diagnosis not present

## 2022-04-22 DIAGNOSIS — Z7982 Long term (current) use of aspirin: Secondary | ICD-10-CM | POA: Diagnosis not present

## 2022-04-22 DIAGNOSIS — Y92210 Daycare center as the place of occurrence of the external cause: Secondary | ICD-10-CM | POA: Diagnosis not present

## 2022-04-22 DIAGNOSIS — R0789 Other chest pain: Secondary | ICD-10-CM | POA: Diagnosis not present

## 2022-04-22 DIAGNOSIS — S8002XA Contusion of left knee, initial encounter: Secondary | ICD-10-CM | POA: Diagnosis not present

## 2022-04-22 DIAGNOSIS — M25562 Pain in left knee: Secondary | ICD-10-CM | POA: Diagnosis not present

## 2022-04-22 DIAGNOSIS — R9431 Abnormal electrocardiogram [ECG] [EKG]: Secondary | ICD-10-CM | POA: Diagnosis not present

## 2022-04-22 DIAGNOSIS — S8992XA Unspecified injury of left lower leg, initial encounter: Secondary | ICD-10-CM | POA: Diagnosis not present

## 2022-04-22 DIAGNOSIS — S299XXA Unspecified injury of thorax, initial encounter: Secondary | ICD-10-CM | POA: Diagnosis not present

## 2022-04-22 MED ORDER — HYDROCODONE-ACETAMINOPHEN 5-325 MG PO TABS
1.0000 | ORAL_TABLET | Freq: Once | ORAL | Status: AC
  Administered 2022-04-22: 1 via ORAL
  Filled 2022-04-22: qty 1

## 2022-04-22 MED ORDER — IBUPROFEN 400 MG PO TABS
400.0000 mg | ORAL_TABLET | Freq: Once | ORAL | Status: AC
  Administered 2022-04-22: 400 mg via ORAL
  Filled 2022-04-22: qty 1

## 2022-04-22 MED ORDER — HYDROCODONE-ACETAMINOPHEN 5-325 MG PO TABS: 1.0000 | ORAL_TABLET | Freq: Four times a day (QID) | ORAL | 0 refills | Status: DC | PRN

## 2022-04-22 NOTE — ED Provider Notes (Signed)
[facility] Provider Note   CSN: [id number] Arrival date & time: 04/22/22  1326     History  Chief Complaint  Patient presents with   Lytle Michaels    Ashley Keith is a 72 y.o. female.  HPI 72 year old female presents after a trip and fall.  She was walking into work and tripped over a rug and fell onto the concrete.  Landed on her left knee.  She think she also hit her hand but her hand does not hurt.  She injured her left chest as well.  Is having pain over her left lateral ribs and left anterior chest.  Did not hit her head or lose consciousness.  She has pain with movement and breathing.  No back pain, abdominal pain, neck pain.  Pain is an 8 out of 10.  Home Medications Prior to Admission medications   Medication Sig Start Date End Date Taking? Authorizing Provider  HYDROcodone-acetaminophen (NORCO) 5-325 MG tablet Take 1 tablet by mouth every 6 (six) hours as needed for severe pain. 04/22/22  Yes Sherwood Gambler, MD  amLODipine (NORVASC) 10 MG tablet Take 10 mg by mouth daily.    [provider]  aspirin 81 MG tablet Take 81 mg by mouth daily.    [provider]  atorvastatin (LIPITOR) 40 MG tablet Take 40 mg by mouth daily. 01/02/18   [provider]  cefdinir (OMNICEF) 300 MG capsule Take 1 capsule (300 mg total) by mouth 2 (two) times daily. 04/06/22   Vanessa Kick, MD  insulin glargine (LANTUS) 100 UNIT/ML injection Inject 42 Units into the skin at bedtime.    [provider]  insulin glargine (LANTUS) 100 UNIT/ML injection Inject 50 Units into the skin at bedtime.     [provider]  JARDIANCE 25 MG TABS tablet Take 25 mg by mouth daily. 12/13/17   [provider]  losartan (COZAAR) 100 MG tablet Take 100 mg by mouth daily. 01/02/18   [provider]      Allergies    Patient has no known allergies.    Review of Systems   Review of Systems  Cardiovascular:  Positive  for chest pain.  Gastrointestinal:  Negative for abdominal pain.  Musculoskeletal:  Positive for arthralgias. Negative for back pain and neck pain.  Neurological:  Negative for headaches.    Physical Exam Updated Vital Signs BP 128/61   Pulse (!) 51   Temp 98.2 F (36.8 C) (Oral)   Resp (!) 21   Ht 5\' 4"  (1.626 m)   Wt 112 kg   SpO2 100%   BMI 42.38 kg/m  Physical Exam Vitals and nursing note reviewed.  Constitutional:      Appearance: She is well-developed.  HENT:     Head: Normocephalic and atraumatic.  Cardiovascular:     Rate and Rhythm: Normal rate and regular rhythm.     Pulses:          Radial pulses are 2+ on the left side.       Dorsalis pedis pulses are 2+ on the left side.     Heart sounds: Normal heart sounds.  Pulmonary:     Effort: Pulmonary effort is normal.     Breath sounds: Normal breath sounds.  Chest:     Chest wall: Tenderness (left lateral ribs. no deformity or bruising) present.  Abdominal:     General: There is no distension.     Palpations: Abdomen is soft.  Tenderness: There is no abdominal tenderness.  Musculoskeletal:     Left wrist: No tenderness. Normal range of motion.     Left hand: No swelling or tenderness.     Cervical back: No tenderness.     Thoracic back: No tenderness.     Lumbar back: No tenderness.     Left hip: No tenderness.     Left knee: No swelling or deformity. Tenderness present.     Left lower leg: No tenderness.     Left foot: No tenderness.  Skin:    General: Skin is warm and dry.  Neurological:     Mental Status: She is alert.     ED Results / Procedures / Treatments   Labs (all labs ordered are listed, but only abnormal results are displayed) Labs Reviewed - No data to display  EKG EKG Interpretation  Date/Time:  Friday April 22 2022 13:57:56 EDT Ventricular Rate:  61 PR Interval:  143 QRS Duration: 91 QT Interval:  428 QTC Calculation: 432 R Axis:   -1 Text Interpretation: Sinus rhythm  no  acute ST/T changes no acute ST/T changes no significant change since 2019 Confirmed by Sherwood Gambler [phone]) on 04/22/2022 3:19:04 PM  Radiology DG Knee Complete 4 Views Left  Result Date: 04/22/2022 CLINICAL DATA:  Fall, knee pain EXAM: LEFT KNEE - COMPLETE 4+ VIEW COMPARISON:  None Available. FINDINGS: There is no evidence of acute fracture. There is mild tricompartment degenerative change. Supra and infrapatellar enthesophytes. No significant joint effusion. Vascular calcifications. IMPRESSION: No acute osseous abnormality. Mild tricompartment osteoarthritis. Electronically Signed   By: Maurine Simmering M.D.   On: 04/22/2022 14:42   DG Ribs Unilateral W/Chest Left  Result Date: 04/22/2022 CLINICAL DATA:  Fall, central sternal pain EXAM: LEFT RIBS AND CHEST - 3+ VIEW COMPARISON:  04/06/2022 FINDINGS: No displaced fracture or other bone lesions are seen involving the ribs. There is no evidence of pneumothorax or pleural effusion. Both lungs are clear. Heart size and mediastinal contours are within normal limits. IMPRESSION: No displaced fracture or radiographic abnormality of the ribs. Please note that particular, plain radiographs are significantly insensitive for sternal fracture. Consider CT to further evaluate if sternal fracture is suspected. Electronically Signed   By: Delanna Ahmadi M.D.   On: 04/22/2022 14:42    Procedures Procedures    Medications Ordered in ED Medications  ibuprofen (ADVIL) tablet 400 mg (400 mg Oral Given 04/22/22 1357)  HYDROcodone-acetaminophen (NORCO/VICODIN) 5-325 MG per tablet 1 tablet (1 tablet Oral Given 04/22/22 1357)    ED Course/ Medical Decision Making/ A&P                             Medical Decision Making Amount and/or Complexity of Data Reviewed Radiology: ordered and independent interpretation performed.    Details: No pneumothorax or obvious rib fracture.  No knee fracture. ECG/medicine tests: independent interpretation performed.    Details: No  acute schema.  Risk Prescription drug management.   Patient appears to have a mild knee contusion with no fracture.  Was able to bear weight.  Neurovascular intact.  As far as her chest wall pain this is probably from contusion versus nondisplaced fracture not seen on x-ray.  There is some mild sternal tenderness though its worst in her left side and I think sternal fracture or at least significant sternal fracture is unlikely.  I doubt blunt cardiac injury.  Feeling better after treatment here and able  to ambulate.  Will discharge home with return precautions and pain control, otherwise seems to have no other significant injuries.  Of note, the initial EMS report that she came from adult daycare was wrong, the patient was actually going to work.        Final Clinical Impression(s) / ED Diagnoses Final diagnoses:  Contusion of rib on left side, initial encounter  Injury of left knee, initial encounter    Rx / DC Orders ED Discharge Orders          Ordered    HYDROcodone-acetaminophen (NORCO) 5-325 MG tablet  Every 6 hours PRN        04/22/22 1529              Sherwood Gambler, MD 04/22/22 1545

## 2022-04-22 NOTE — ED Triage Notes (Signed)
Pt the ed from a adult daycare center per ems. Pt tripped over a rug and fell on her left side.Pt denies hitting head, loc, dizziness. Pt relays left sided chest pain. Pain increases with deep breath. Pt and ems relays no blood thinners.

## 2022-04-22 NOTE — Discharge Instructions (Addendum)
If you develop new or worsening pain in your chest or side, trouble breathing, coughing up blood, fever, or any other new/concerning symptoms then return to the ER or call 911.

## 2022-04-25 DIAGNOSIS — W19XXXD Unspecified fall, subsequent encounter: Secondary | ICD-10-CM | POA: Diagnosis not present

## 2022-04-25 DIAGNOSIS — R0789 Other chest pain: Secondary | ICD-10-CM | POA: Diagnosis not present

## 2022-04-25 DIAGNOSIS — M25562 Pain in left knee: Secondary | ICD-10-CM | POA: Diagnosis not present

## 2022-04-25 DIAGNOSIS — R0781 Pleurodynia: Secondary | ICD-10-CM | POA: Diagnosis not present

## 2022-04-26 ENCOUNTER — Other Ambulatory Visit (HOSPITAL_COMMUNITY): Payer: Self-pay | Admitting: Family Medicine

## 2022-04-26 DIAGNOSIS — R0789 Other chest pain: Secondary | ICD-10-CM

## 2022-05-02 ENCOUNTER — Encounter: Payer: Self-pay | Admitting: Family Medicine

## 2022-05-02 ENCOUNTER — Ambulatory Visit (INDEPENDENT_AMBULATORY_CARE_PROVIDER_SITE_OTHER): Payer: Worker's Compensation | Admitting: Family Medicine

## 2022-05-02 VITALS — BP 160/59 | Ht 64.0 in | Wt 221.0 lb

## 2022-05-02 DIAGNOSIS — S8002XA Contusion of left knee, initial encounter: Secondary | ICD-10-CM | POA: Diagnosis not present

## 2022-05-02 DIAGNOSIS — S299XXA Unspecified injury of thorax, initial encounter: Secondary | ICD-10-CM | POA: Diagnosis not present

## 2022-05-02 MED ORDER — HYDROCODONE-ACETAMINOPHEN 5-325 MG PO TABS: 1.0000 | ORAL_TABLET | Freq: Four times a day (QID) | ORAL | 0 refills | Status: DC | PRN

## 2022-05-02 NOTE — Patient Instructions (Addendum)
You have a knee contusion and likely sternal/rib contusions. Get the CT scan tomorrow. Icing 15 minutes at a time 3-4 times a day as needed. Aleve 1-2 tabs twice a day with food only if needed. Take hydrocodone as needed for severe pain. Don't take tylenol with hydrocodone because hydrocodone has tylenol in it. Work from home next 2 weeks if available otherwise remain out of work. Follow up with me in 2 weeks.

## 2022-05-02 NOTE — Progress Notes (Signed)
PCP: Lujean Amel, MD  Subjective:   HPI: Patient is a 72 y.o. female here for left rib, knee injuries.  Patient reports she was at work on 3/15 - coming outside at lunch she tripped over the carpet and fell forward onto her left knee and left side. Immediate pain anterior left knee, anterior chest and left ribs. She went to ED and had x-rays of left knee and left ribs which were negative. She has a chest CT scheduled for tomorrow. She had shortness of breath but this is better. No hemoptysis, new cough. Has been doing her incentive spirometer 3-4 times a day. Her left knee is improving - pain anterior. No catching, locking, giving out.  Past Medical History:  Diagnosis Date   Arthritis    Depression    Diabetes mellitus    GERD (gastroesophageal reflux disease)    Hypertension    Obesity     Current Outpatient Medications on File Prior to Visit  Medication Sig Dispense Refill   amLODipine (NORVASC) 10 MG tablet Take 10 mg by mouth daily.     aspirin 81 MG tablet Take 81 mg by mouth daily.     atorvastatin (LIPITOR) 40 MG tablet Take 40 mg by mouth daily.  0   cefdinir (OMNICEF) 300 MG capsule Take 1 capsule (300 mg total) by mouth 2 (two) times daily. 20 capsule 0   insulin glargine (LANTUS) 100 UNIT/ML injection Inject 42 Units into the skin at bedtime.     insulin glargine (LANTUS) 100 UNIT/ML injection Inject 50 Units into the skin at bedtime.      JARDIANCE 25 MG TABS tablet Take 25 mg by mouth daily.  2   losartan (COZAAR) 100 MG tablet Take 100 mg by mouth daily.  2   No current facility-administered medications on file prior to visit.    Past Surgical History:  Procedure Laterality Date   ANKLE SURGERY      No Known Allergies  BP (!) 160/59   Ht 5\' 4"  (1.626 m)   Wt 221 lb (100.2 kg)   BMI 37.93 kg/m       No data to display              No data to display              Objective:  Physical Exam:  Gen: NAD, comfortable in exam  room  Chest: symmetric expansion.  Tender to palpation lateral ribs over ribs 7-9 without stepoff, crepitus.  Tender inferior sternum also without stepoff or crepitus Lungs: CTAB without wheezes, rales, rhonchi.  Left knee:  No gross deformity, ecchymoses, swelling. Mild TTP over patella.  No other tenderness. FROM with normal strength. Negative ant/post drawers. Negative valgus/varus testing. Negative lachman.  Negative mcmurrays, apleys.  NV intact distally.   Assessment & Plan:  1. Left chest wall pain - 2/2 fall on 3/15.  Likely sternal and rib contusions though x-rays can be falsely negative for fracture.  She is scheduled to get CT of chest tomorrow - advised to follow through with this.  Continue incentive spirometer.   Icing, aleve with hydrocodone if needed.  F/u in 2 weeks.  Work from home for next 2 weeks if possible.  2. Left knee contusion - 2/2 fall on 3/15.  radiographs negative.  Icing, aleve.

## 2022-05-03 ENCOUNTER — Ambulatory Visit (HOSPITAL_BASED_OUTPATIENT_CLINIC_OR_DEPARTMENT_OTHER)
Admission: RE | Admit: 2022-05-03 | Discharge: 2022-05-03 | Disposition: A | Discharge status: Home / Self Care | Payer: Worker's Compensation | Source: Ambulatory Visit | Attending: Family Medicine | Admitting: Family Medicine

## 2022-05-03 DIAGNOSIS — R0789 Other chest pain: Secondary | ICD-10-CM | POA: Insufficient documentation

## 2022-05-16 ENCOUNTER — Encounter: Payer: Self-pay | Admitting: Family Medicine

## 2022-05-16 ENCOUNTER — Ambulatory Visit (INDEPENDENT_AMBULATORY_CARE_PROVIDER_SITE_OTHER): Payer: Worker's Compensation | Admitting: Family Medicine

## 2022-05-16 VITALS — BP 175/61 | Ht 64.0 in | Wt 221.0 lb

## 2022-05-16 DIAGNOSIS — S299XXD Unspecified injury of thorax, subsequent encounter: Secondary | ICD-10-CM

## 2022-05-16 DIAGNOSIS — S8002XD Contusion of left knee, subsequent encounter: Secondary | ICD-10-CM | POA: Diagnosis not present

## 2022-05-16 NOTE — Patient Instructions (Signed)
I'm glad you're doing better! Your CT scan was normal. Icing, aleve only if needed. Expect some soreness for 3-4 more weeks but this should resolve. Ok to return to work. Follow up with me as needed.

## 2022-05-16 NOTE — Progress Notes (Addendum)
PCP: Darrow Bussing, MD  Subjective:   HPI: Patient is a 72 y.o. female here for left rib, knee injuries.  3/25: Patient reports she was at work on 3/15 - coming outside at lunch she tripped over the carpet and fell forward onto her left knee and left side. Immediate pain anterior left knee, anterior chest and left ribs. She went to ED and had x-rays of left knee and left ribs which were negative. She has a chest CT scheduled for tomorrow. She had shortness of breath but this is better. No hemoptysis, new cough. Has been doing her incentive spirometer 3-4 times a day. Her left knee is improving - pain anterior. No catching, locking, giving out.  4/8: Patient reports much improvement since last visit. Still soreness to touch on anterior chest wall and ribs. Breathing is better. Has stopped using incentive spirometer. Taking rare 1/2 tablet of hydrocodone if needed. Not back at work - unable to accommodate working from home. Her left knee is sore if kneeling down.  Past Medical History:  Diagnosis Date   Arthritis    Depression    Diabetes mellitus    GERD (gastroesophageal reflux disease)    Hypertension    Obesity     Current Outpatient Medications on File Prior to Visit  Medication Sig Dispense Refill   amLODipine (NORVASC) 10 MG tablet Take 10 mg by mouth daily.     aspirin 81 MG tablet Take 81 mg by mouth daily.     atorvastatin (LIPITOR) 40 MG tablet Take 40 mg by mouth daily.  0   cefdinir (OMNICEF) 300 MG capsule Take 1 capsule (300 mg total) by mouth 2 (two) times daily. 20 capsule 0   HYDROcodone-acetaminophen (NORCO) 5-325 MG tablet Take 1 tablet by mouth every 6 (six) hours as needed for severe pain. 20 tablet 0   insulin glargine (LANTUS) 100 UNIT/ML injection Inject 42 Units into the skin at bedtime.     insulin glargine (LANTUS) 100 UNIT/ML injection Inject 50 Units into the skin at bedtime.      JARDIANCE 25 MG TABS tablet Take 25 mg by mouth daily.  2    losartan (COZAAR) 100 MG tablet Take 100 mg by mouth daily.  2   No current facility-administered medications on file prior to visit.    Past Surgical History:  Procedure Laterality Date   ANKLE SURGERY      No Known Allergies  BP (!) 175/61   Ht 5\' 4"  (1.626 m)   Wt 221 lb (100.2 kg)   BMI 37.93 kg/m       No data to display              No data to display              Objective:  Physical Exam:  Gen: NAD, comfortable in exam room  Chest: symmetric expansion. Lungs: CTAB without wheezes, rales, rhonchi.  Left knee: No gross deformity, ecchymoses, swelling. Mild TTP over patella.  No other tenderness. FROM with normal strength. Negative ant/post drawers. Negative valgus/varus testing. Negative lachman.  Negative mcmurrays, apleys.  NV intact distally.   Assessment & Plan:  1. Left chest wall pain - 2/2 fall on 3/15 at work.  Sternal and rib contusions.  X-rays and CT scan negative for musculoskeletal pathology.  Icing, aleve if needed.  Return to work full duty.  She has 0% permanent partial impairment rating at maximum medical improvement.  2. Left knee contusion - 2/2 fall  on 3/15.  Radiographs negative.  Icing, aleve if needed.  F/u prn.

## 2022-06-22 DIAGNOSIS — Z961 Presence of intraocular lens: Secondary | ICD-10-CM | POA: Diagnosis not present

## 2022-06-22 DIAGNOSIS — H18513 Endothelial corneal dystrophy, bilateral: Secondary | ICD-10-CM | POA: Diagnosis not present

## 2022-08-17 ENCOUNTER — Encounter (INDEPENDENT_AMBULATORY_CARE_PROVIDER_SITE_OTHER): Payer: Medicare Other | Admitting: Ophthalmology

## 2022-09-14 ENCOUNTER — Encounter (INDEPENDENT_AMBULATORY_CARE_PROVIDER_SITE_OTHER): Payer: Medicare Other | Admitting: Ophthalmology

## 2022-09-14 DIAGNOSIS — E113391 Type 2 diabetes mellitus with moderate nonproliferative diabetic retinopathy without macular edema, right eye: Secondary | ICD-10-CM | POA: Diagnosis not present

## 2022-09-14 DIAGNOSIS — E113292 Type 2 diabetes mellitus with mild nonproliferative diabetic retinopathy without macular edema, left eye: Secondary | ICD-10-CM

## 2022-09-14 DIAGNOSIS — D3131 Benign neoplasm of right choroid: Secondary | ICD-10-CM

## 2022-09-14 DIAGNOSIS — H43813 Vitreous degeneration, bilateral: Secondary | ICD-10-CM

## 2022-09-14 DIAGNOSIS — H35033 Hypertensive retinopathy, bilateral: Secondary | ICD-10-CM | POA: Diagnosis not present

## 2022-09-14 DIAGNOSIS — I10 Essential (primary) hypertension: Secondary | ICD-10-CM | POA: Diagnosis not present

## 2022-09-14 DIAGNOSIS — Z7984 Long term (current) use of oral hypoglycemic drugs: Secondary | ICD-10-CM

## 2022-09-30 DIAGNOSIS — H2511 Age-related nuclear cataract, right eye: Secondary | ICD-10-CM | POA: Diagnosis not present

## 2022-09-30 DIAGNOSIS — H35033 Hypertensive retinopathy, bilateral: Secondary | ICD-10-CM | POA: Diagnosis not present

## 2022-09-30 DIAGNOSIS — H18513 Endothelial corneal dystrophy, bilateral: Secondary | ICD-10-CM | POA: Diagnosis not present

## 2022-09-30 DIAGNOSIS — H52223 Regular astigmatism, bilateral: Secondary | ICD-10-CM | POA: Diagnosis not present

## 2022-09-30 DIAGNOSIS — E113293 Type 2 diabetes mellitus with mild nonproliferative diabetic retinopathy without macular edema, bilateral: Secondary | ICD-10-CM | POA: Diagnosis not present

## 2022-09-30 DIAGNOSIS — H5213 Myopia, bilateral: Secondary | ICD-10-CM | POA: Diagnosis not present

## 2022-09-30 DIAGNOSIS — H524 Presbyopia: Secondary | ICD-10-CM | POA: Diagnosis not present

## 2022-11-02 DIAGNOSIS — Z23 Encounter for immunization: Secondary | ICD-10-CM | POA: Diagnosis not present

## 2022-11-02 DIAGNOSIS — E1165 Type 2 diabetes mellitus with hyperglycemia: Secondary | ICD-10-CM | POA: Diagnosis not present

## 2022-11-02 DIAGNOSIS — I700 Atherosclerosis of aorta: Secondary | ICD-10-CM | POA: Diagnosis not present

## 2022-11-02 DIAGNOSIS — Z79899 Other long term (current) drug therapy: Secondary | ICD-10-CM | POA: Diagnosis not present

## 2022-11-16 DIAGNOSIS — Z794 Long term (current) use of insulin: Secondary | ICD-10-CM | POA: Diagnosis not present

## 2022-11-16 DIAGNOSIS — E042 Nontoxic multinodular goiter: Secondary | ICD-10-CM | POA: Diagnosis not present

## 2022-11-16 DIAGNOSIS — M8580 Other specified disorders of bone density and structure, unspecified site: Secondary | ICD-10-CM | POA: Diagnosis not present

## 2022-11-16 DIAGNOSIS — E1165 Type 2 diabetes mellitus with hyperglycemia: Secondary | ICD-10-CM | POA: Diagnosis not present

## 2022-12-21 DIAGNOSIS — E042 Nontoxic multinodular goiter: Secondary | ICD-10-CM | POA: Diagnosis not present

## 2022-12-21 DIAGNOSIS — Z794 Long term (current) use of insulin: Secondary | ICD-10-CM | POA: Diagnosis not present

## 2022-12-21 DIAGNOSIS — M8580 Other specified disorders of bone density and structure, unspecified site: Secondary | ICD-10-CM | POA: Diagnosis not present

## 2022-12-21 DIAGNOSIS — E1165 Type 2 diabetes mellitus with hyperglycemia: Secondary | ICD-10-CM | POA: Diagnosis not present

## 2023-01-18 ENCOUNTER — Other Ambulatory Visit: Payer: Self-pay | Admitting: Family Medicine

## 2023-01-18 DIAGNOSIS — Z1231 Encounter for screening mammogram for malignant neoplasm of breast: Secondary | ICD-10-CM

## 2023-02-15 ENCOUNTER — Ambulatory Visit
Admission: RE | Admit: 2023-02-15 | Discharge: 2023-02-15 | Disposition: A | Discharge status: Home / Self Care | Payer: Medicare Other | Source: Ambulatory Visit | Attending: Family Medicine | Admitting: Family Medicine

## 2023-02-15 DIAGNOSIS — Z1231 Encounter for screening mammogram for malignant neoplasm of breast: Secondary | ICD-10-CM | POA: Diagnosis not present

## 2023-03-22 DIAGNOSIS — Z0001 Encounter for general adult medical examination with abnormal findings: Secondary | ICD-10-CM | POA: Diagnosis not present

## 2023-03-22 DIAGNOSIS — Z1331 Encounter for screening for depression: Secondary | ICD-10-CM | POA: Diagnosis not present

## 2023-03-22 DIAGNOSIS — E1165 Type 2 diabetes mellitus with hyperglycemia: Secondary | ICD-10-CM | POA: Diagnosis not present

## 2023-03-22 DIAGNOSIS — E7800 Pure hypercholesterolemia, unspecified: Secondary | ICD-10-CM | POA: Diagnosis not present

## 2023-03-22 DIAGNOSIS — Z79899 Other long term (current) drug therapy: Secondary | ICD-10-CM | POA: Diagnosis not present

## 2023-03-22 DIAGNOSIS — K760 Fatty (change of) liver, not elsewhere classified: Secondary | ICD-10-CM | POA: Diagnosis not present

## 2023-04-28 ENCOUNTER — Other Ambulatory Visit: Payer: Self-pay | Admitting: Family Medicine

## 2023-04-28 DIAGNOSIS — E2839 Other primary ovarian failure: Secondary | ICD-10-CM

## 2023-05-10 ENCOUNTER — Ambulatory Visit: Admitting: Podiatry

## 2023-05-10 ENCOUNTER — Encounter: Payer: Self-pay | Admitting: Podiatry

## 2023-05-10 DIAGNOSIS — B351 Tinea unguium: Secondary | ICD-10-CM | POA: Diagnosis not present

## 2023-05-10 DIAGNOSIS — M79675 Pain in left toe(s): Secondary | ICD-10-CM

## 2023-05-10 DIAGNOSIS — M79674 Pain in right toe(s): Secondary | ICD-10-CM

## 2023-05-10 NOTE — Progress Notes (Signed)
   Chief Complaint  Patient presents with   Diabetes    Patient is diabetic with onychomycosis/thick nails DFC-Trim    SUBJECTIVE Patient with a history of diabetes mellitus presents to office today complaining of elongated, thickened nails that cause pain while ambulating in shoes.  Patient is unable to trim their own nails. Patient is here for further evaluation and treatment.  Past Medical History:  Diagnosis Date   Arthritis    Depression    Diabetes mellitus    GERD (gastroesophageal reflux disease)    Hypertension    Obesity     No Known Allergies   OBJECTIVE General Patient is awake, alert, and oriented x 3 and in no acute distress. Derm Skin is dry and supple bilateral. Negative open lesions or macerations. Remaining integument unremarkable. Nails are tender, long, thickened and dystrophic with subungual debris, consistent with onychomycosis, 1-5 bilateral. No signs of infection noted. Vasc  DP and PT pedal pulses palpable bilaterally. Temperature gradient within normal limits.  Neuro Epicritic and protective threshold sensation diminished bilaterally.  Musculoskeletal Exam No symptomatic pedal deformities noted bilateral. Muscular strength within normal limits.  ASSESSMENT 1. Diabetes Mellitus w/ peripheral neuropathy 2.  Pain due to onychomycosis of toenails bilateral  PLAN OF CARE 1. Patient evaluated today. 2. Instructed to maintain good pedal hygiene and foot care. Stressed importance of controlling blood sugar.  3. Mechanical debridement of nails 1-5 bilaterally performed using a nail nipper. Filed with dremel without incident.  4. Return to clinic in 3 mos.     Felecia Shelling, DPM [facility]  Dr. Felecia Shelling, DPM    [address] [place], [place] [address]                Office [phone]  Fax [phone]

## 2023-07-17 ENCOUNTER — Ambulatory Visit (HOSPITAL_COMMUNITY)
Admission: EM | Admit: 2023-07-17 | Discharge: 2023-07-17 | Disposition: A | Discharge status: Home / Self Care | Attending: Family Medicine | Admitting: Family Medicine

## 2023-07-17 ENCOUNTER — Encounter (HOSPITAL_COMMUNITY): Payer: Self-pay | Admitting: *Deleted

## 2023-07-17 ENCOUNTER — Ambulatory Visit (INDEPENDENT_AMBULATORY_CARE_PROVIDER_SITE_OTHER)

## 2023-07-17 ENCOUNTER — Other Ambulatory Visit: Payer: Self-pay

## 2023-07-17 DIAGNOSIS — M4187 Other forms of scoliosis, lumbosacral region: Secondary | ICD-10-CM | POA: Diagnosis not present

## 2023-07-17 DIAGNOSIS — M545 Low back pain, unspecified: Secondary | ICD-10-CM

## 2023-07-17 DIAGNOSIS — S39012A Strain of muscle, fascia and tendon of lower back, initial encounter: Secondary | ICD-10-CM

## 2023-07-17 MED ORDER — MELOXICAM 15 MG PO TABS: ORAL_TABLET | ORAL | 0 refills | Status: DC

## 2023-07-17 MED ORDER — CYCLOBENZAPRINE HCL 10 MG PO TABS: 10.0000 mg | ORAL_TABLET | Freq: Two times a day (BID) | ORAL | 0 refills | Status: AC | PRN

## 2023-07-17 NOTE — ED Provider Notes (Signed)
 [facility]    CSN: [id number] Arrival date & time: 07/17/23  4098      History   Chief Complaint Chief Complaint  Patient presents with   Fall    HPI Ashley Keith is a 73 y.o. female.   Patient presenting after a fall few days ago.  Patient notes that she fell but did not hit her head.  Patient states that she been having lower back pain since then.  Patient's low back pain is all left-sided paraspinally.  Patient has some sharp stabbing whenever she is twisting turning.  No radiation down the leg.  Otherwise no other concerns at this time.   Fall    Past Medical History:  Diagnosis Date   Arthritis    Depression    Diabetes mellitus    GERD (gastroesophageal reflux disease)    Hypertension    Obesity     Patient Active Problem List   Diagnosis Date Noted   Obesity, unspecified 12/26/2012   Pure hypercholesterolemia 09/02/2012   Type II or unspecified type diabetes mellitus without mention of complication, uncontrolled 03/09/2011   Hypertension 03/09/2011    Past Surgical History:  Procedure Laterality Date   ANKLE SURGERY      OB History   No obstetric history on file.      Home Medications    Prior to Admission medications   Medication Sig Start Date End Date Taking? Authorizing Provider  amLODipine (NORVASC) 10 MG tablet Take 10 mg by mouth daily.   Yes [provider]  aspirin 81 MG tablet Take 81 mg by mouth daily.   Yes [provider]  atorvastatin (LIPITOR) 40 MG tablet Take 40 mg by mouth daily. 01/02/18  Yes [provider]  cyclobenzaprine (FLEXERIL) 10 MG tablet Take 1 tablet (10 mg total) by mouth 2 (two) times daily as needed for muscle spasms. 07/17/23  Yes Azim Gillingham, MD  insulin glargine (LANTUS) 100 UNIT/ML injection Inject 42 Units into the skin at bedtime.   Yes [provider]  insulin glargine (LANTUS) 100 UNIT/ML injection Inject 50 Units into the skin at bedtime.    Yes [provider]  JARDIANCE 25 MG TABS tablet Take 25 mg by mouth daily. 12/13/17  Yes [provider]  losartan (COZAAR) 100 MG tablet Take 100 mg by mouth daily. 01/02/18  Yes [provider]  meloxicam (MOBIC) 15 MG tablet Take 1 tablet daily with food for 10 days. Then take as needed. 07/17/23  Yes Jillyn Stacey, MD  cefdinir  (OMNICEF ) 300 MG capsule Take 1 capsule (300 mg total) by mouth 2 (two) times daily. 04/06/22   Afton Albright, MD  HYDROcodone -acetaminophen  (NORCO) 5-325 MG tablet Take 1 tablet by mouth every 6 (six) hours as needed for severe pain. 05/02/22   Salina Craver, MD    Family History Family History  Problem Relation Age of Onset   Hypertension Mother    Diabetes Mother    Cerebral aneurysm Mother    Breast cancer Mother 28   Cerebral aneurysm Father    Breast cancer Maternal Aunt 70    Social History Social History   Tobacco Use   Smoking status: Never   Smokeless tobacco: Never  Substance Use Topics   Alcohol use: No   Drug use: No     Allergies   Patient has no known allergies.   Review of Systems Review of Systems   Physical Exam Triage Vital Signs ED Triage Vitals  Encounter  Vitals Group     BP 07/17/23 1050 (!) 161/72     Systolic BP Percentile --      Diastolic BP Percentile --      Pulse Rate 07/17/23 1050 (!) 54     Resp 07/17/23 1050 20     Temp 07/17/23 1050 98.5 F (36.9 C)     Temp src --      SpO2 07/17/23 1050 98 %     Weight --      Height --      Head Circumference --      Peak Flow --      Pain Score 07/17/23 1048 5     Pain Loc --      Pain Education --      Exclude from Growth Chart --    No data found.  Updated Vital Signs BP (!) 161/72   Pulse (!) 54   Temp 98.5 F (36.9 C)   Resp 20   SpO2 98%   Visual Acuity Right Eye Distance:   Left Eye Distance:   Bilateral Distance:    Right Eye Near:   Left Eye Near:    Bilateral Near:     Physical Exam  Inspection reveals no gross  abnormalities of the lumbar spine.  There are some just palpation over the lower left lumbar paraspinal muscles.  Range of motion is full with flexion extension though there is some pain noted with both movements.  Straight leg test is negative.  No radicular symptoms noted in the lower extremities. UC Treatments / Results  Labs (all labs ordered are listed, but only abnormal results are displayed) Labs Reviewed - No data to display  EKG   Radiology DG Lumbar Spine Complete Result Date: 07/17/2023 CLINICAL DATA:  Pain. EXAM: LUMBAR SPINE - COMPLETE 4+ VIEW COMPARISON:  Lumbar spine radiographs 06/19/2010. Lumbar MRI 05/24/2011. FINDINGS: Detail limited by body habitus. There are 5 lumbar type vertebral bodies. Minimal convex left scoliosis. The lateral alignment is normal. The disc spaces are preserved. No evidence of acute fracture or pars defect. There are facet degenerative changes inferiorly which have progressed from the prior radiographs. Aortoiliac atherosclerosis noted. IMPRESSION: No acute osseous findings or significant disc degenerative changes. Progression of facet degenerative changes inferiorly. Electronically Signed   By: Elmon Hagedorn M.D.   On: 07/17/2023 11:59    Procedures Procedures (including critical care time)  Medications Ordered in UC Medications - No data to display  Initial Impression / Assessment and Plan / UC Course  I have reviewed the triage vital signs and the nursing notes.  Pertinent labs & imaging results that were available during my care of the patient were reviewed by me and considered in my medical decision making (see chart for details).     Patient likely dealing with a lumbar strain at this time, we will go ahead and do meloxicam and Flexeril.  Patient vies take Flexeril as needed or likely at nighttime.  Also advised patient to use heat over the affected area.  Patient understanding and agreeable with plan. Final Clinical Impressions(s) / UC  Diagnoses   Final diagnoses:  Strain of lumbar region, initial encounter     Discharge Instructions      You have been diagnosed with a lumbar strain, which is a common cause of lower back pain resulting from overstretched or injured muscles. This condition often improves with conservative management.  Treatment Plan:  Medication:  Meloxicam (NSAID): Take one tablet daily for  14 days, then use as needed for pain or inflammation. Take with food to reduce stomach upset.  Flexeril (Cyclobenzaprine): Take as needed for muscle spasms, especially at night. Do not drive or operate heavy machinery while taking this medication, as it may cause drowsiness.  Heat Therapy:  Apply heat to the lower back area for 15-20 minutes at a time, 2-3 times daily, especially in the morning or before bed. Use a heating pad or warm compress. Avoid direct contact with the skin to prevent burns.  Activity:  Avoid heavy lifting, twisting motions, or prolonged sitting.  Gentle movement and walking are encouraged to prevent stiffness.  Avoid bed rest beyond short periods, as it can delay recovery.  Follow-Up:  If symptoms persist beyond 2 weeks, worsen, or if you develop numbness, weakness, or loss of bowel/bladder control, seek medical attention promptly.    ED Prescriptions     Medication Sig Dispense Auth. Provider   meloxicam (MOBIC) 15 MG tablet Take 1 tablet daily with food for 10 days. Then take as needed. 40 tablet Azula Zappia, MD   cyclobenzaprine (FLEXERIL) 10 MG tablet Take 1 tablet (10 mg total) by mouth 2 (two) times daily as needed for muscle spasms. 20 tablet Evalyne Cortopassi, MD      PDMP not reviewed this encounter.   Jude Norton, MD 07/17/23 [phone]

## 2023-07-17 NOTE — Discharge Instructions (Signed)
 You have been diagnosed with a lumbar strain, which is a common cause of lower back pain resulting from overstretched or injured muscles. This condition often improves with conservative management.  Treatment Plan:  Medication:  Meloxicam (NSAID): Take one tablet daily for 14 days, then use as needed for pain or inflammation. Take with food to reduce stomach upset.  Flexeril (Cyclobenzaprine): Take as needed for muscle spasms, especially at night. Do not drive or operate heavy machinery while taking this medication, as it may cause drowsiness.  Heat Therapy:  Apply heat to the lower back area for 15-20 minutes at a time, 2-3 times daily, especially in the morning or before bed. Use a heating pad or warm compress. Avoid direct contact with the skin to prevent burns.  Activity:  Avoid heavy lifting, twisting motions, or prolonged sitting.  Gentle movement and walking are encouraged to prevent stiffness.  Avoid bed rest beyond short periods, as it can delay recovery.  Follow-Up:  If symptoms persist beyond 2 weeks, worsen, or if you develop numbness, weakness, or loss of bowel/bladder control, seek medical attention promptly.

## 2023-07-17 NOTE — ED Triage Notes (Signed)
 PT reports she fell yesterday and her legs were in a split posiition the patient did not hit her head. Pt now has back pain .

## 2023-08-07 ENCOUNTER — Other Ambulatory Visit: Payer: Self-pay

## 2023-08-07 ENCOUNTER — Encounter (HOSPITAL_COMMUNITY): Payer: Self-pay

## 2023-08-07 ENCOUNTER — Ambulatory Visit (INDEPENDENT_AMBULATORY_CARE_PROVIDER_SITE_OTHER)
Admission: EM | Admit: 2023-08-07 | Discharge: 2023-08-07 | Disposition: A | Discharge status: Home / Self Care | Source: Ambulatory Visit

## 2023-08-07 ENCOUNTER — Inpatient Hospital Stay (HOSPITAL_COMMUNITY)
Admission: EM | Admit: 2023-08-07 | Discharge: 2023-08-12 | DRG: 872 | Disposition: A | Discharge status: Home / Self Care | Source: Ambulatory Visit | Attending: Family Medicine | Admitting: Family Medicine

## 2023-08-07 ENCOUNTER — Emergency Department (HOSPITAL_COMMUNITY)

## 2023-08-07 DIAGNOSIS — E66812 Obesity, class 2: Secondary | ICD-10-CM | POA: Diagnosis not present

## 2023-08-07 DIAGNOSIS — E1165 Type 2 diabetes mellitus with hyperglycemia: Secondary | ICD-10-CM | POA: Diagnosis not present

## 2023-08-07 DIAGNOSIS — Z803 Family history of malignant neoplasm of breast: Secondary | ICD-10-CM | POA: Diagnosis not present

## 2023-08-07 DIAGNOSIS — E871 Hypo-osmolality and hyponatremia: Secondary | ICD-10-CM | POA: Diagnosis present

## 2023-08-07 DIAGNOSIS — E669 Obesity, unspecified: Secondary | ICD-10-CM | POA: Diagnosis present

## 2023-08-07 DIAGNOSIS — R112 Nausea with vomiting, unspecified: Secondary | ICD-10-CM | POA: Insufficient documentation

## 2023-08-07 DIAGNOSIS — Z833 Family history of diabetes mellitus: Secondary | ICD-10-CM | POA: Diagnosis not present

## 2023-08-07 DIAGNOSIS — K219 Gastro-esophageal reflux disease without esophagitis: Secondary | ICD-10-CM | POA: Diagnosis present

## 2023-08-07 DIAGNOSIS — A419 Sepsis, unspecified organism: Secondary | ICD-10-CM | POA: Diagnosis not present

## 2023-08-07 DIAGNOSIS — R509 Fever, unspecified: Secondary | ICD-10-CM | POA: Diagnosis not present

## 2023-08-07 DIAGNOSIS — N12 Tubulo-interstitial nephritis, not specified as acute or chronic: Secondary | ICD-10-CM | POA: Diagnosis not present

## 2023-08-07 DIAGNOSIS — E861 Hypovolemia: Secondary | ICD-10-CM | POA: Diagnosis not present

## 2023-08-07 DIAGNOSIS — Z794 Long term (current) use of insulin: Secondary | ICD-10-CM

## 2023-08-07 DIAGNOSIS — R6889 Other general symptoms and signs: Secondary | ICD-10-CM

## 2023-08-07 DIAGNOSIS — I10 Essential (primary) hypertension: Secondary | ICD-10-CM | POA: Diagnosis present

## 2023-08-07 DIAGNOSIS — A4151 Sepsis due to Escherichia coli [E. coli]: Principal | ICD-10-CM | POA: Diagnosis present

## 2023-08-07 DIAGNOSIS — Z20822 Contact with and (suspected) exposure to covid-19: Secondary | ICD-10-CM | POA: Diagnosis not present

## 2023-08-07 DIAGNOSIS — Z7984 Long term (current) use of oral hypoglycemic drugs: Secondary | ICD-10-CM | POA: Diagnosis not present

## 2023-08-07 DIAGNOSIS — K760 Fatty (change of) liver, not elsewhere classified: Secondary | ICD-10-CM | POA: Diagnosis not present

## 2023-08-07 DIAGNOSIS — N10 Acute tubulo-interstitial nephritis: Secondary | ICD-10-CM | POA: Diagnosis present

## 2023-08-07 DIAGNOSIS — Z7982 Long term (current) use of aspirin: Secondary | ICD-10-CM | POA: Diagnosis not present

## 2023-08-07 DIAGNOSIS — Z8249 Family history of ischemic heart disease and other diseases of the circulatory system: Secondary | ICD-10-CM

## 2023-08-07 DIAGNOSIS — R051 Acute cough: Secondary | ICD-10-CM | POA: Diagnosis not present

## 2023-08-07 DIAGNOSIS — Z6837 Body mass index (BMI) 37.0-37.9, adult: Secondary | ICD-10-CM

## 2023-08-07 DIAGNOSIS — Z79899 Other long term (current) drug therapy: Secondary | ICD-10-CM | POA: Diagnosis not present

## 2023-08-07 DIAGNOSIS — E876 Hypokalemia: Secondary | ICD-10-CM | POA: Diagnosis not present

## 2023-08-07 DIAGNOSIS — K429 Umbilical hernia without obstruction or gangrene: Secondary | ICD-10-CM | POA: Diagnosis not present

## 2023-08-07 DIAGNOSIS — R7881 Bacteremia: Secondary | ICD-10-CM | POA: Diagnosis not present

## 2023-08-07 DIAGNOSIS — R310 Gross hematuria: Secondary | ICD-10-CM | POA: Diagnosis present

## 2023-08-07 DIAGNOSIS — E860 Dehydration: Secondary | ICD-10-CM | POA: Diagnosis present

## 2023-08-07 LAB — URINALYSIS, ROUTINE W REFLEX MICROSCOPIC
Bilirubin Urine: NEGATIVE
Glucose, UA: 150 mg/dL — AB
Ketones, ur: NEGATIVE mg/dL
Nitrite: NEGATIVE
Protein, ur: 100 mg/dL — AB
Specific Gravity, Urine: 1.025 (ref 1.005–1.030)
WBC, UA: >50 WBC/hpf (ref 0–5)
pH: 5 (ref 5.0–8.0)

## 2023-08-07 LAB — POCT URINALYSIS DIP (MANUAL ENTRY)
Glucose, UA: 100 mg/dL — AB
Nitrite, UA: NEGATIVE
Protein Ur, POC: >=300 mg/dL — AB
Spec Grav, UA: 1.025 (ref 1.010–1.025)
Urobilinogen, UA: >=8 U/dL — AB
pH, UA: 5.5 (ref 5.0–8.0)

## 2023-08-07 LAB — CBC WITH DIFFERENTIAL/PLATELET
Abs Immature Granulocytes: 0.57 10*3/uL — ABNORMAL HIGH (ref 0.00–0.07)
Basophils Absolute: 0.1 10*3/uL (ref 0.0–0.1)
Basophils Relative: 0 %
Eosinophils Absolute: 0 10*3/uL (ref 0.0–0.5)
Eosinophils Relative: 0 %
HCT: 37 % (ref 36.0–46.0)
Hemoglobin: 12.3 g/dL (ref 12.0–15.0)
Immature Granulocytes: 3 %
Lymphocytes Relative: 8 %
Lymphs Abs: 1.8 10*3/uL (ref 0.7–4.0)
MCH: 30.7 pg (ref 26.0–34.0)
MCHC: 33.2 g/dL (ref 30.0–36.0)
MCV: 92.3 fL (ref 80.0–100.0)
Monocytes Absolute: 2.6 10*3/uL — ABNORMAL HIGH (ref 0.1–1.0)
Monocytes Relative: 12 %
Neutro Abs: 17.2 10*3/uL — ABNORMAL HIGH (ref 1.7–7.7)
Neutrophils Relative %: 77 %
Platelets: 240 10*3/uL (ref 150–400)
RBC: 4.01 MIL/uL (ref 3.87–5.11)
RDW: 12.8 % (ref 11.5–15.5)
WBC: 22.2 10*3/uL — ABNORMAL HIGH (ref 4.0–10.5)
nRBC: 0 % (ref 0.0–0.2)

## 2023-08-07 LAB — COMPREHENSIVE METABOLIC PANEL WITH GFR
ALT: 25 U/L (ref 0–44)
AST: 30 U/L (ref 15–41)
Albumin: 2.7 g/dL — ABNORMAL LOW (ref 3.5–5.0)
Alkaline Phosphatase: 188 U/L — ABNORMAL HIGH (ref 38–126)
Anion gap: 10 (ref 5–15)
BUN: 27 mg/dL — ABNORMAL HIGH (ref 8–23)
CO2: 22 mmol/L (ref 22–32)
Calcium: 8.1 mg/dL — ABNORMAL LOW (ref 8.9–10.3)
Chloride: 96 mmol/L — ABNORMAL LOW (ref 98–111)
Creatinine, Ser: 1.05 mg/dL — ABNORMAL HIGH (ref 0.44–1.00)
GFR, Estimated: 56 mL/min — ABNORMAL LOW (ref >60)
Glucose, Bld: 284 mg/dL — ABNORMAL HIGH (ref 70–99)
Potassium: 3.5 mmol/L (ref 3.5–5.1)
Sodium: 128 mmol/L — ABNORMAL LOW (ref 135–145)
Total Bilirubin: 1.8 mg/dL — ABNORMAL HIGH (ref 0.0–1.2)
Total Protein: 6.6 g/dL (ref 6.5–8.1)

## 2023-08-07 LAB — CBG MONITORING, ED: Glucose-Capillary: 195 mg/dL — ABNORMAL HIGH (ref 70–99)

## 2023-08-07 LAB — POC SARS CORONAVIRUS 2 AG -  ED: SARS Coronavirus 2 Ag: NEGATIVE

## 2023-08-07 LAB — LACTIC ACID, PLASMA
Lactic Acid, Venous: 1.3 mmol/L (ref 0.5–1.9)
Lactic Acid, Venous: 1.4 mmol/L (ref 0.5–1.9)

## 2023-08-07 MED ORDER — CEFTRIAXONE SODIUM 1 G IJ SOLR: 1.0000 g | Freq: Once | INTRAMUSCULAR | Status: DC

## 2023-08-07 MED ORDER — INSULIN ASPART 100 UNIT/ML IJ SOLN
0.0000 [IU] | Freq: Three times a day (TID) | INTRAMUSCULAR | Status: DC
  Administered 2023-08-08 (×2): 3 [IU] via SUBCUTANEOUS
  Administered 2023-08-08: 2 [IU] via SUBCUTANEOUS

## 2023-08-07 MED ORDER — LACTATED RINGERS IV BOLUS
1000.0000 mL | Freq: Once | INTRAVENOUS | Status: AC
  Administered 2023-08-07: 1000 mL via INTRAVENOUS

## 2023-08-07 MED ORDER — SODIUM CHLORIDE 0.9 % IV SOLN: INTRAVENOUS | Status: AC

## 2023-08-07 MED ORDER — ACETAMINOPHEN 325 MG PO TABS
ORAL_TABLET | ORAL | Status: AC
  Filled 2023-08-07: qty 3

## 2023-08-07 MED ORDER — INSULIN GLARGINE-YFGN 100 UNIT/ML ~~LOC~~ SOPN: 95.0000 [IU] | PEN_INJECTOR | Freq: Every day | SUBCUTANEOUS | Status: DC

## 2023-08-07 MED ORDER — ONDANSETRON HCL 4 MG/2ML IJ SOLN: 4.0000 mg | Freq: Four times a day (QID) | INTRAMUSCULAR | Status: DC | PRN

## 2023-08-07 MED ORDER — ACETAMINOPHEN 325 MG PO TABS
650.0000 mg | ORAL_TABLET | Freq: Once | ORAL | Status: AC
  Administered 2023-08-07: 650 mg via ORAL
  Filled 2023-08-07: qty 2

## 2023-08-07 MED ORDER — ACETAMINOPHEN 325 MG PO TABS
975.0000 mg | ORAL_TABLET | Freq: Once | ORAL | Status: AC
  Administered 2023-08-07: 975 mg via ORAL

## 2023-08-07 MED ORDER — ENOXAPARIN SODIUM 40 MG/0.4ML IJ SOSY
40.0000 mg | PREFILLED_SYRINGE | INTRAMUSCULAR | Status: DC
  Administered 2023-08-07 – 2023-08-08 (×2): 40 mg via SUBCUTANEOUS
  Filled 2023-08-07: qty 0.4

## 2023-08-07 MED ORDER — ONDANSETRON 4 MG PO TBDP: 4.0000 mg | ORAL_TABLET | Freq: Once | ORAL | Status: DC

## 2023-08-07 MED ORDER — SENNOSIDES-DOCUSATE SODIUM 8.6-50 MG PO TABS: 1.0000 | ORAL_TABLET | Freq: Every evening | ORAL | Status: DC | PRN

## 2023-08-07 MED ORDER — ONDANSETRON HCL 4 MG PO TABS: 4.0000 mg | ORAL_TABLET | Freq: Four times a day (QID) | ORAL | Status: DC | PRN

## 2023-08-07 MED ORDER — MORPHINE SULFATE (PF) 2 MG/ML IV SOLN
2.0000 mg | Freq: Once | INTRAVENOUS | Status: DC
  Filled 2023-08-07: qty 1

## 2023-08-07 MED ORDER — ONDANSETRON HCL 4 MG/2ML IJ SOLN
4.0000 mg | Freq: Once | INTRAMUSCULAR | Status: AC
  Administered 2023-08-07: 4 mg via INTRAVENOUS
  Filled 2023-08-07: qty 2

## 2023-08-07 MED ORDER — ACETAMINOPHEN 325 MG PO TABS
650.0000 mg | ORAL_TABLET | Freq: Four times a day (QID) | ORAL | Status: DC | PRN
  Administered 2023-08-08 – 2023-08-09 (×3): 650 mg via ORAL
  Filled 2023-08-07 (×3): qty 2

## 2023-08-07 MED ORDER — INSULIN GLARGINE-YFGN 100 UNIT/ML ~~LOC~~ SOLN
95.0000 [IU] | Freq: Every day | SUBCUTANEOUS | Status: DC
  Administered 2023-08-08 – 2023-08-10 (×3): 95 [IU] via SUBCUTANEOUS
  Filled 2023-08-07 (×6): qty 0.95

## 2023-08-07 MED ORDER — ACETAMINOPHEN 650 MG RE SUPP: 650.0000 mg | Freq: Four times a day (QID) | RECTAL | Status: DC | PRN

## 2023-08-07 MED ORDER — OXYCODONE-ACETAMINOPHEN 5-325 MG PO TABS
1.0000 | ORAL_TABLET | Freq: Once | ORAL | Status: AC
  Administered 2023-08-07: 1 via ORAL
  Filled 2023-08-07: qty 1

## 2023-08-07 MED ORDER — SODIUM CHLORIDE 0.9 % IV SOLN
2.0000 g | INTRAVENOUS | Status: DC
  Administered 2023-08-08 – 2023-08-10 (×3): 2 g via INTRAVENOUS
  Filled 2023-08-07 (×3): qty 20

## 2023-08-07 MED ORDER — INSULIN ASPART 100 UNIT/ML IJ SOLN: 0.0000 [IU] | Freq: Every day | INTRAMUSCULAR | Status: DC

## 2023-08-07 MED ORDER — SODIUM CHLORIDE 0.9 % IV SOLN
2.0000 g | Freq: Once | INTRAVENOUS | Status: AC
  Administered 2023-08-07: 2 g via INTRAVENOUS
  Filled 2023-08-07: qty 20

## 2023-08-07 NOTE — H&P (Signed)
 History and Physical  Ashley Keith FMW:[id number] DOB: 06-May-1950 DOA: 08/07/2023  PCP: Regino Slater, MD   Chief Complaint: Flank pain, nausea and vomiting  HPI: Ashley Keith is a 73 y.o. female with medical history significant for type 2 diabetes, depression, arthritis, GERD, HTN and obesity who presented to the ED from the urgent care for evaluation of flank pain, nausea and vomiting.   ED Course: Initial vitals show temp 102.4, RR 20, HR 90, BP 121/64, SpO2 95% on room air. Initial labs significant for sodium 128, glucose 284, creatinine 1.05, alk phos 188, bilirubin 1.8, lactic acid 1.3, WBC 22.2, UA shows glucosuria, large hemoglobinuria, moderate proteinuria, negative nitrite, moderate leuks, WBC >50, and many bacteria.  CT A/P shows fatty liver but no acute intraabdominal/pelvic pathology. Pt received Tylenol  650 mg x 1, IV LR 1 L x 1, Percocet 5-325 mg x 1, IV Zofran 4 mg x 1 and IV Rocephin. TRH was consulted for admission.   Review of Systems: Please see HPI for pertinent positives and negatives. A complete 10 system review of systems are otherwise negative.  Past Medical History:  Diagnosis Date   Arthritis    Depression    Diabetes mellitus    GERD (gastroesophageal reflux disease)    Hypertension    Obesity    Past Surgical History:  Procedure Laterality Date   ANKLE SURGERY     Social History:  reports that she has never smoked. She has never used smokeless tobacco. She reports that she does not drink alcohol and does not use drugs.  No Known Allergies  Family History  Problem Relation Age of Onset   Hypertension Mother    Diabetes Mother    Cerebral aneurysm Mother    Breast cancer Mother 55   Cerebral aneurysm Father    Breast cancer Maternal Aunt 30     Prior to Admission medications   Medication Sig Start Date End Date Taking? Authorizing Provider  amLODipine (NORVASC) 10 MG tablet Take 10 mg by mouth daily.    [provider]  aspirin  81 MG tablet Take 81 mg by mouth daily.    [provider]  atorvastatin (LIPITOR) 40 MG tablet Take 40 mg by mouth daily. 01/02/18   [provider]  cyclobenzaprine  (FLEXERIL ) 10 MG tablet Take 1 tablet (10 mg total) by mouth 2 (two) times daily as needed for muscle spasms. 07/17/23   Jha, Panav, MD  insulin glargine (LANTUS) 100 UNIT/ML injection Inject 42 Units into the skin at bedtime.    [provider]  insulin glargine (LANTUS) 100 UNIT/ML injection Inject 50 Units into the skin at bedtime.     [provider]  JARDIANCE 25 MG TABS tablet Take 25 mg by mouth daily. 12/13/17   [provider]  losartan (COZAAR) 100 MG tablet Take 100 mg by mouth daily. 01/02/18   [provider]  meloxicam  (MOBIC ) 15 MG tablet Take 1 tablet daily with food for 10 days. Then take as needed. 07/17/23   Vita Morrow, MD    Physical Exam: BP (!) 180/86 (BP Location: Left Arm)   Pulse 66   Temp (!) 103.2 F (39.6 C) (Oral)   Resp 18   Wt 98.9 kg   SpO2 97%   BMI 37.42 kg/m  General: Pleasant, well-appearing *** laying in bed. No acute distress. HEENT: [place]/AT. Anicteric sclera CV: RRR. No murmurs, rubs, or gallops. No LE edema Pulmonary: Lungs CTAB. Normal effort. No wheezing or rales. Abdominal: Soft,  nontender, nondistended. Normal bowel sounds. Extremities: Palpable radial and DP pulses. Normal ROM. Skin: Warm and dry. No obvious rash or lesions. Neuro: A&Ox3. Moves all extremities. Normal sensation to light touch. No focal deficit. Psych: Normal mood and affect          Labs on Admission:  Basic Metabolic Panel: Recent Labs  Lab 08/07/23 1430  NA 128*  K 3.5  CL 96*  CO2 22  GLUCOSE 284*  BUN 27*  CREATININE 1.05*  CALCIUM 8.1*   Liver Function Tests: Recent Labs  Lab 08/07/23 1430  AST 30  ALT 25  ALKPHOS 188*  BILITOT 1.8*  PROT 6.6  ALBUMIN 2.7*   No results for input(s): LIPASE, AMYLASE in the last 168 hours. No  results for input(s): AMMONIA in the last 168 hours. CBC: Recent Labs  Lab 08/07/23 1430  WBC 22.2*  NEUTROABS 17.2*  HGB 12.3  HCT 37.0  MCV 92.3  PLT 240   Cardiac Enzymes: No results for input(s): CKTOTAL, CKMB, CKMBINDEX, TROPONINI in the last 168 hours. BNP (last 3 results) No results for input(s): BNP in the last 8760 hours.  ProBNP (last 3 results) No results for input(s): PROBNP in the last 8760 hours.  CBG: Recent Labs  Lab 08/07/23 2046  GLUCAP 195*    Radiological Exams on Admission: CT ABDOMEN PELVIS WO CONTRAST Result Date: 08/07/2023 CLINICAL DATA:  UTI. EXAM: CT ABDOMEN AND PELVIS WITHOUT CONTRAST TECHNIQUE: Multidetector CT imaging of the abdomen and pelvis was performed following the standard protocol without IV contrast. RADIATION DOSE REDUCTION: This exam was performed according to the departmental dose-optimization program which includes automated exposure control, adjustment of the mA and/or kV according to patient size and/or use of iterative reconstruction technique. COMPARISON:  CT dated 01/16/2018. FINDINGS: Evaluation of this exam is limited in the absence of intravenous contrast. Lower chest: Bibasilar subpleural atelectasis. No intra-abdominal free air or free fluid. Hepatobiliary: Fatty liver. No biliary dilatation. Probable minimal sludge or layering small stones in the gallbladder. No pericholecystic fluid or evidence of acute cholecystitis by CT. Pancreas: Unremarkable. No pancreatic ductal dilatation or surrounding inflammatory changes. Spleen: Normal in size without focal abnormality. Adrenals/Urinary Tract: The adrenal glands unremarkable. Small left renal inter pole fatty lesion similar to prior CT and in keeping with angiomyolipoma. There is no hydronephrosis on physis on either side. The visualized ureters and urinary bladder appear unremarkable. Stomach/Bowel: There is no bowel obstruction or active inflammation. The appendix is  normal. Vascular/Lymphatic: Moderate aortoiliac atherosclerotic disease. The IVC is unremarkable. No portal gas. There is no adenopathy. Reproductive: Hysterectomy.  No suspicious adnexal masses. Other: Small fat containing umbilical hernia. Musculoskeletal: Osteopenia with degenerative changes. No acute osseous pathology. IMPRESSION: 1. No acute intra-abdominal or pelvic pathology. No hydronephrosis or nephrolithiasis. 2. Fatty liver. 3.  Aortic Atherosclerosis (ICD10-I70.0). Electronically Signed   By: Vanetta Chou M.D.   On: 08/07/2023 17:36   Assessment/Plan Ashley Keith is a 73 y.o. female with medical history significant for type 2 diabetes, depression, arthritis, GERD, HTN and obesity who presented to the ED from the urgent care for evaluation of flank pain, nausea and vomiting and admitted for sepsis secondary to acute pyelonephritis.    # Sepsis # Acute pyelonephritis - Presented with *** - *** - *** - Met sepsis criteria with *** - Continue *** - Start IV hydration with IV*** @ ***/hr - Follow-up blood and urine cultures - Trend CBC, fever curve  # Type 2 diabetes with hyperglycemia - Last A1c *** -  Blood glucose of *** on admission - Home regimen includes *** - *** - SSI with meals, CBG monitoring - Carb modified diet - F/u repeat A1c  # HTN  # HLD  #***  #***   DVT prophylaxis: Lovenox     Code Status: Full Code  Consults called: None  Family Communication: Discussed admission with son at bedside  Severity of Illness: The appropriate patient status for this patient is INPATIENT. Inpatient status is judged to be reasonable and necessary in order to provide the required intensity of service to ensure the patient's safety. The patient's presenting symptoms, physical exam findings, and initial radiographic and laboratory data in the context of their chronic comorbidities is felt to place them at high risk for further clinical deterioration. Furthermore, it  is not anticipated that the patient will be medically stable for discharge from the hospital within 2 midnights of admission.   * I certify that at the point of admission it is my clinical judgment that the patient will require inpatient hospital care spanning beyond 2 midnights from the point of admission due to high intensity of service, high risk for further deterioration and high frequency of surveillance required.*  Level of care: Telemetry   This record has been created using Conservation officer, historic buildings. Errors have been sought and corrected, but may not always be located. Such creation errors do not reflect on the standard of care.   Lou Claretta HERO, MD 08/07/2023, 9:17 PM [facility] Pager: [phone] Isaiah 41:10   If 7PM-7AM, please contact night-coverage [email or web address] Password [email or web address]

## 2023-08-07 NOTE — ED Provider Notes (Signed)
 [facility]    CSN: [id number] Arrival date & time: 08/07/23  9052      History   Chief Complaint Chief Complaint  Patient presents with   Emesis    HPI Ashley Keith is a 73 y.o. female.   Ashley Keith is a 73 y.o. female presenting for chief complaint of dark/cloudy urine for the last week, fever/chills that started few days ago, and nausea/vomiting that started yesterday.  She has had multiple episodes of nonbloody/nonbilious emesis in the last 24 hours.  She remains nauseous and states she feels very weak overall.  Reports bilateral lower quadrant abdominal discomfort as well as left upper back pain starting a few days ago.  She is experiencing urinary frequency, urinary urgency, and had 1 episode of gross hematuria this morning without dysuria, dizziness, headache, or diarrhea. She has also been sick with cough and congestion for the last 6 days.  Her boss at work tested positive for COVID-19 and she is concerned that she may have the same.  Denies shortness of breath, chest tightness, orthopnea, leg swelling, and history of chronic respiratory problems.  No rash. History of type 2 diabetes.  She drinks mostly cranberry juice and water.  Denies other intake of urinary irritants.  Takes Jardiance SGLT2 inhibitor. Currently febrile at 102.4.  Last episode of emesis was 1 to 2 hours ago.  She is able to tolerate fluids without vomiting currently.   Emesis   Past Medical History:  Diagnosis Date   Arthritis    Depression    Diabetes mellitus    GERD (gastroesophageal reflux disease)    Hypertension    Obesity     Patient Active Problem List   Diagnosis Date Noted   Obesity, unspecified 12/26/2012   Pure hypercholesterolemia 09/02/2012   Type II or unspecified type diabetes mellitus without mention of complication, uncontrolled 03/09/2011   Hypertension 03/09/2011    Past Surgical History:  Procedure Laterality Date   ANKLE SURGERY      OB History    No obstetric history on file.      Home Medications    Prior to Admission medications   Medication Sig Start Date End Date Taking? Authorizing Provider  amLODipine (NORVASC) 10 MG tablet Take 10 mg by mouth daily.    [provider]  aspirin 81 MG tablet Take 81 mg by mouth daily.    [provider]  atorvastatin (LIPITOR) 40 MG tablet Take 40 mg by mouth daily. 01/02/18   [provider]  cyclobenzaprine  (FLEXERIL ) 10 MG tablet Take 1 tablet (10 mg total) by mouth 2 (two) times daily as needed for muscle spasms. 07/17/23   Jha, Panav, MD  insulin glargine (LANTUS) 100 UNIT/ML injection Inject 42 Units into the skin at bedtime.    [provider]  insulin glargine (LANTUS) 100 UNIT/ML injection Inject 50 Units into the skin at bedtime.     [provider]  JARDIANCE 25 MG TABS tablet Take 25 mg by mouth daily. 12/13/17   [provider]  losartan (COZAAR) 100 MG tablet Take 100 mg by mouth daily. 01/02/18   [provider]  meloxicam  (MOBIC ) 15 MG tablet Take 1 tablet daily with food for 10 days. Then take as needed. 07/17/23   Vita Morrow, MD    Family History Family History  Problem Relation Age of Onset   Hypertension Mother    Diabetes Mother    Cerebral aneurysm Mother    Breast cancer Mother 7  Cerebral aneurysm Father    Breast cancer Maternal Aunt 30    Social History Social History   Tobacco Use   Smoking status: Never   Smokeless tobacco: Never  Substance Use Topics   Alcohol use: No   Drug use: No     Allergies   Patient has no known allergies.   Review of Systems Review of Systems  Gastrointestinal:  Positive for vomiting.  Per HPI   Physical Exam Triage Vital Signs ED Triage Vitals  Encounter Vitals Group     BP 08/07/23 1125 121/64     Girls Systolic BP Percentile --      Girls Diastolic BP Percentile --      Boys Systolic BP Percentile --      Boys Diastolic BP Percentile --       Pulse Rate 08/07/23 1125 90     Resp 08/07/23 1125 20     Temp 08/07/23 1125 (!) 102.4 F (39.1 C)     Temp Source 08/07/23 1125 Oral     SpO2 08/07/23 1125 95 %     Weight --      Height --      Head Circumference --      Peak Flow --      Pain Score 08/07/23 1126 6     Pain Loc --      Pain Education --      Exclude from Growth Chart --    No data found.  Updated Vital Signs BP 121/64 (BP Location: Right Arm)   Pulse 90   Temp (!) 102.4 F (39.1 C) (Oral)   Resp 20   SpO2 95%   Visual Acuity Right Eye Distance:   Left Eye Distance:   Bilateral Distance:    Right Eye Near:   Left Eye Near:    Bilateral Near:     Physical Exam Vitals and nursing note reviewed.  Constitutional:      Appearance: She is ill-appearing. She is not toxic-appearing.  HENT:     Head: Normocephalic and atraumatic.     Right Ear: Hearing, tympanic membrane, ear canal and external ear normal.     Left Ear: Hearing, tympanic membrane, ear canal and external ear normal.     Nose: Nose normal.     Mouth/Throat:     Lips: Pink.     Mouth: Mucous membranes are dry. No injury or oral lesions.     Dentition: Normal dentition.     Tongue: No lesions.     Pharynx: Oropharynx is clear. Uvula midline. No pharyngeal swelling, oropharyngeal exudate, posterior oropharyngeal erythema, uvula swelling or postnasal drip.     Tonsils: No tonsillar exudate.   Eyes:     General: Lids are normal. Vision grossly intact. Gaze aligned appropriately.     Extraocular Movements: Extraocular movements intact.     Conjunctiva/sclera: Conjunctivae normal.   Neck:     Trachea: Trachea and phonation normal.   Cardiovascular:     Rate and Rhythm: Normal rate and regular rhythm.     Heart sounds: Normal heart sounds, S1 normal and S2 normal.  Pulmonary:     Effort: Pulmonary effort is normal. No respiratory distress.     Breath sounds: Normal breath sounds and air entry. No wheezing, rhonchi or rales.  Chest:      Chest wall: No tenderness.  Abdominal:     General: Abdomen is flat.     Tenderness: There is no abdominal tenderness. There is left  CVA tenderness. There is no right CVA tenderness, guarding or rebound.   Musculoskeletal:     Cervical back: Neck supple.  Lymphadenopathy:     Cervical: No cervical adenopathy.   Skin:    General: Skin is warm and dry.     Capillary Refill: Capillary refill takes less than 2 seconds.     Findings: No rash.   Neurological:     General: No focal deficit present.     Mental Status: She is alert and oriented to person, place, and time. Mental status is at baseline.     Cranial Nerves: No dysarthria or facial asymmetry.   Psychiatric:        Mood and Affect: Mood normal.        Speech: Speech normal.        Behavior: Behavior normal.        Thought Content: Thought content normal.        Judgment: Judgment normal.      UC Treatments / Results  Labs (all labs ordered are listed, but only abnormal results are displayed) Labs Reviewed  POCT URINALYSIS DIP (MANUAL ENTRY) - Abnormal; Notable for the following components:      Result Value   Color, UA red (*)    Clarity, UA cloudy (*)    Glucose, UA =100 (*)    Bilirubin, UA moderate (*)    Ketones, POC UA small (15) (*)    Blood, UA large (*)    Protein Ur, POC >=300 (*)    Urobilinogen, UA >=8.0 (*)    Leukocytes, UA Large (3+) (*)    All other components within normal limits  POC SARS CORONAVIRUS 2 AG -  ED - Normal  URINE CULTURE    EKG   Radiology No results found.  Procedures Procedures (including critical care time)  Medications Ordered in UC Medications  acetaminophen  (TYLENOL ) tablet 975 mg (975 mg Oral Given 08/07/23 1134)    Initial Impression / Assessment and Plan / UC Course  I have reviewed the triage vital signs and the nursing notes.  Pertinent labs & imaging results that were available during my care of the patient were reviewed by me and considered in my medical  decision making (see chart for details).   1.  Acute pyelonephritis, fever, nausea and vomiting, acute cough Question whether fever is secondary to viral upper respiratory infection versus acute pyelonephritis. I suspect fever is most likely due to pyelonephritis given urinalysis findings.  There is concern for potential urosepsis given patient's age, fever, and history of type 2 diabetes.  Nonfocal neuroexam though she feels very weak overall due to viral URI/pyelonephritis.  Point-of-care COVID and flu testing is negative.  I would like for her to proceed to the nearest emergency room for further workup and evaluation to rule out urosepsis, AKI, and dehydration with electrolyte imbalance with stat blood work etc. I considered giving her IV fluids and drawing stat labs here with IM ceftriaxone and Zofran IV, however it would be safest for her to go to the emergency room to have stat labs and rule out urosepsis given progressively worsening disease and new onset fever, left flank pain, nausea vomiting, and age.  Discussed clinical concerns/exam findings leading to recommendation for further workup in the ER setting and risks of deferring ER visit with patient/family. Patient/family express understanding and agreement with plan, discharged to ER via private car.    Final Clinical Impressions(s) / UC Diagnoses   Final diagnoses:  Acute  pyelonephritis  Fever, unspecified  Nausea and vomiting, unspecified vomiting type  Acute cough     Discharge Instructions      Please go to the nearest emergency room for further workup evaluation.  I am concerned that you may need IV antibiotics due to potential bloodstream infection.     ED Prescriptions   None    PDMP not reviewed this encounter.   Enedelia Dorna HERO, [place] 08/07/23 1326

## 2023-08-07 NOTE — ED Triage Notes (Signed)
 POV/ sent by UC/ dx with UTI/ pt c/o vomiting and severe lower back pain/ pt A&Ox4/ placed in wheelchair

## 2023-08-07 NOTE — ED Provider Notes (Signed)
 [facility] AT [facility] Provider Note   CSN: [id number] Arrival date & time: 08/07/23  1349     Patient presents with: Vomiting and Back Pain   Ashley Keith is a 73 y.o. female.   73 year old female past medical history of diabetes and hypertension presenting to the emergency department today with fever, increased urinary frequency and urgency, and left-sided flank pain.  The patient states has been going on now for the past few days.  Reports that this morning she was having some nausea and vomiting.  This is nonbloody and nonbilious.  She reports having normal bowel movements with this.  Denies any pain in the center of her back.  The pain is mostly on the flanks.  States that it is worse on the left than the right.  She went to urgent care was subsequently sent to the ER for further evaluation.  She has noticed some intermittent blood in her urine.  Her pain has been intermittent in nature over the past few days.  She came to the ER today for further evaluation regarding this due to ongoing symptoms.   Back Pain Associated symptoms: fever        Prior to Admission medications   Medication Sig Start Date End Date Taking? Authorizing Provider  amLODipine (NORVASC) 10 MG tablet Take 10 mg by mouth daily.    [provider]  aspirin 81 MG tablet Take 81 mg by mouth daily.    [provider]  atorvastatin (LIPITOR) 40 MG tablet Take 40 mg by mouth daily. 01/02/18   [provider]  cyclobenzaprine  (FLEXERIL ) 10 MG tablet Take 1 tablet (10 mg total) by mouth 2 (two) times daily as needed for muscle spasms. 07/17/23   Jha, Panav, MD  insulin glargine (LANTUS) 100 UNIT/ML injection Inject 42 Units into the skin at bedtime.    [provider]  insulin glargine (LANTUS) 100 UNIT/ML injection Inject 50 Units into the skin at bedtime.     [provider]  JARDIANCE 25 MG TABS tablet Take 25 mg by mouth daily.  12/13/17   [provider]  losartan (COZAAR) 100 MG tablet Take 100 mg by mouth daily. 01/02/18   [provider]  meloxicam  (MOBIC ) 15 MG tablet Take 1 tablet daily with food for 10 days. Then take as needed. 07/17/23   Jha, Panav, MD    Allergies: Patient has no known allergies.    Review of Systems  Constitutional:  Positive for fever.  Genitourinary:  Positive for frequency and urgency.  Musculoskeletal:  Positive for back pain.  All other systems reviewed and are negative.   Updated Vital Signs BP (!) 180/86 (BP Location: Left Arm)   Pulse 66   Temp (!) 103.2 F (39.6 C) (Oral)   Resp 18   Wt 98.9 kg   SpO2 97%   BMI 37.42 kg/m   Physical Exam Vitals and nursing note reviewed.   Gen: NAD Eyes: PERRL, EOMI HEENT: no oropharyngeal swelling Neck: trachea midline Resp: clear to auscultation bilaterally Card: RRR, no murmurs, rubs, or gallops Abd: nontender, nondistended, left-sided CVA tenderness noted Extremities: no calf tenderness, no edema Vascular: 2+ radial pulses bilaterally, 2+ DP pulses bilaterally Skin: no rashes Psyc: acting appropriately   (all labs ordered are listed, but only abnormal results are displayed) Labs Reviewed  COMPREHENSIVE METABOLIC PANEL WITH GFR - Abnormal; Notable for the following components:      Result Value   Sodium 128 (*)  Chloride 96 (*)    Glucose, Bld 284 (*)    BUN 27 (*)    Creatinine, Ser 1.05 (*)    Calcium 8.1 (*)    Albumin 2.7 (*)    Alkaline Phosphatase 188 (*)    Total Bilirubin 1.8 (*)    GFR, Estimated 56 (*)    All other components within normal limits  CBC WITH DIFFERENTIAL/PLATELET - Abnormal; Notable for the following components:   WBC 22.2 (*)    Neutro Abs 17.2 (*)    Monocytes Absolute 2.6 (*)    Abs Immature Granulocytes 0.57 (*)    All other components within normal limits  URINALYSIS, ROUTINE W REFLEX MICROSCOPIC - Abnormal; Notable for the following components:   Color, Urine  AMBER (*)    APPearance CLOUDY (*)    Glucose, UA 150 (*)    Hgb urine dipstick LARGE (*)    Protein, ur 100 (*)    Leukocytes,Ua MODERATE (*)    Bacteria, UA MANY (*)    All other components within normal limits  CBG MONITORING, ED - Abnormal; Notable for the following components:   Glucose-Capillary 195 (*)    All other components within normal limits  CULTURE, BLOOD (ROUTINE X 2)  CULTURE, BLOOD (ROUTINE X 2)  LACTIC ACID, PLASMA  LACTIC ACID, PLASMA    EKG: None  Radiology: CT ABDOMEN PELVIS WO CONTRAST Result Date: 08/07/2023 CLINICAL DATA:  UTI. EXAM: CT ABDOMEN AND PELVIS WITHOUT CONTRAST TECHNIQUE: Multidetector CT imaging of the abdomen and pelvis was performed following the standard protocol without IV contrast. RADIATION DOSE REDUCTION: This exam was performed according to the departmental dose-optimization program which includes automated exposure control, adjustment of the mA and/or kV according to patient size and/or use of iterative reconstruction technique. COMPARISON:  CT dated 01/16/2018. FINDINGS: Evaluation of this exam is limited in the absence of intravenous contrast. Lower chest: Bibasilar subpleural atelectasis. No intra-abdominal free air or free fluid. Hepatobiliary: Fatty liver. No biliary dilatation. Probable minimal sludge or layering small stones in the gallbladder. No pericholecystic fluid or evidence of acute cholecystitis by CT. Pancreas: Unremarkable. No pancreatic ductal dilatation or surrounding inflammatory changes. Spleen: Normal in size without focal abnormality. Adrenals/Urinary Tract: The adrenal glands unremarkable. Small left renal inter pole fatty lesion similar to prior CT and in keeping with angiomyolipoma. There is no hydronephrosis on physis on either side. The visualized ureters and urinary bladder appear unremarkable. Stomach/Bowel: There is no bowel obstruction or active inflammation. The appendix is normal. Vascular/Lymphatic: Moderate  aortoiliac atherosclerotic disease. The IVC is unremarkable. No portal gas. There is no adenopathy. Reproductive: Hysterectomy.  No suspicious adnexal masses. Other: Small fat containing umbilical hernia. Musculoskeletal: Osteopenia with degenerative changes. No acute osseous pathology. IMPRESSION: 1. No acute intra-abdominal or pelvic pathology. No hydronephrosis or nephrolithiasis. 2. Fatty liver. 3.  Aortic Atherosclerosis (ICD10-I70.0). Electronically Signed   By: Vanetta Chou M.D.   On: 08/07/2023 17:36     Procedures   Medications Ordered in the ED  morphine (PF) 2 MG/ML injection 2 mg (2 mg Intravenous Patient Refused/Not Given 08/07/23 1654)  acetaminophen  (TYLENOL ) tablet 650 mg (has no administration in time range)  ondansetron (ZOFRAN) injection 4 mg (4 mg Intravenous Given 08/07/23 1631)  cefTRIAXone (ROCEPHIN) 2 g in sodium chloride 0.9 % 100 mL IVPB (0 g Intravenous Stopped 08/07/23 1900)  lactated ringers bolus 1,000 mL (1,000 mLs Intravenous New Bag/Given 08/07/23 1816)  oxyCODONE -acetaminophen  (PERCOCET/ROXICET) 5-325 MG per tablet 1 tablet (1 tablet Oral Given  08/07/23 2053)                                    Medical Decision Making 73 year old female with past medical history of diabetes, hypertension presenting to the emergency department today with flank pain, fever, nausea, vomiting, increased urinary frequency and urgency.  Will further evaluate the patient here with basic labs as well as a urinalysis to evaluate for pyelonephritis.  Initial labs from triage do show a significant leukocytosis and some mild hyponatremia at 128.  Will give the patient some IV fluids.  Will obtain a CT scan to evaluate for ureteral stones giving the intermittent nature of her pain.  Will also give her morphine and Zofran to see if this helps with her symptoms in addition to Rocephin as her urinalysis from urgent care does show findings concerning for UTI.  I will reevaluate the patient for  ultimate disposition.  The patient does have a leukocytosis with a white count of 22,000.  CT scan is unremarkable.  On reassessment the patient is now having rigors and high fevers of 103.  She is feeling very generally weak.  Given these findings calls placed to hospitalist service for admission for further symptomatic treatment and IV antibiotic therapy.  Amount and/or Complexity of Data Reviewed Labs: ordered. Radiology: ordered.  Risk OTC drugs. Prescription drug management. Decision regarding hospitalization.        Final diagnoses:  Pyelonephritis  Rigors    ED Discharge Orders     None          Ula Prentice SAUNDERS, MD 08/07/23 [phone]

## 2023-08-07 NOTE — ED Notes (Signed)
 Patient is being discharged from the Urgent Care and sent to the Emergency Department via POV . Per Michelle,NP, patient is in need of higher level of care due to fever and septic work up . Patient is aware and verbalizes understanding of plan of care.  Vitals:   08/07/23 1125  BP: 121/64  Pulse: 90  Resp: 20  Temp: (!) 102.4 F (39.1 C)  SpO2: 95%

## 2023-08-07 NOTE — H&P (Incomplete)
 History and Physical  Georgenia Salim FMW:[id number] DOB: [birth year] DOA: 08/07/2023  PCP: Regino Slater, MD   Chief Complaint: Flank pain, nausea and vomiting  HPI: Ashley Keith is a 73 y.o. female with medical history significant for type 2 diabetes, depression, arthritis, GERD, HTN and obesity who presented to the ED from the urgent care for evaluation of flank pain, nausea and vomiting.  Patient reports that last Wednesday, she started having pain in her left lower back with some associated urinary hesitancy and frequency.  She attempted to manage this with Tylenol  and heating pad without significant improvement.  Last night, she had worsening pain so she presented to the urgent care and was sent directly here.  She also endorsed lower abdominal pressure, fever, dark red urine, mild headache and emesis x 3 this morning.  He denies any dysuria, chest pain, shortness of breath, dizziness or bloody stools.  ED Course: Initial vitals show temp 102.4, RR 20, HR 90, BP 121/64, SpO2 95% on room air. Initial labs significant for sodium 128, glucose 284, creatinine 1.05, alk phos 188, bilirubin 1.8, lactic acid 1.3, WBC 22.2, UA shows glucosuria, large hemoglobinuria, moderate proteinuria, negative nitrite, moderate leuks, WBC >50, and many bacteria.  CT A/P shows fatty liver but no acute intraabdominal/pelvic pathology. Pt received Tylenol  650 mg x 1, IV LR 1 L x 1, Percocet 5-325 mg x 1, IV Zofran 4 mg x 1 and IV Rocephin. TRH was consulted for admission.   Review of Systems: Please see HPI for pertinent positives and negatives. A complete 10 system review of systems are otherwise negative.  Past Medical History:  Diagnosis Date  . Arthritis   . Depression   . Diabetes mellitus   . GERD (gastroesophageal reflux disease)   . Hypertension   . Obesity    Past Surgical History:  Procedure Laterality Date  . ANKLE SURGERY     Social History:  reports that she has never smoked. She has never  used smokeless tobacco. She reports that she does not drink alcohol and does not use drugs.  No Known Allergies  Family History  Problem Relation Age of Onset  . Hypertension Mother   . Diabetes Mother   . Cerebral aneurysm Mother   . Breast cancer Mother 57  . Cerebral aneurysm Father   . Breast cancer Maternal Aunt 30     Prior to Admission medications   Medication Sig Start Date End Date Taking? Authorizing Provider  amLODipine (NORVASC) 10 MG tablet Take 10 mg by mouth daily.    [provider]  aspirin 81 MG tablet Take 81 mg by mouth daily.    [provider]  atorvastatin (LIPITOR) 40 MG tablet Take 40 mg by mouth daily. 01/02/18   [provider]  cyclobenzaprine  (FLEXERIL ) 10 MG tablet Take 1 tablet (10 mg total) by mouth 2 (two) times daily as needed for muscle spasms. 07/17/23   Jha, Panav, MD  insulin glargine (LANTUS) 100 UNIT/ML injection Inject 42 Units into the skin at bedtime.    [provider]  insulin glargine (LANTUS) 100 UNIT/ML injection Inject 50 Units into the skin at bedtime.     [provider]  JARDIANCE 25 MG TABS tablet Take 25 mg by mouth daily. 12/13/17   [provider]  losartan (COZAAR) 100 MG tablet Take 100 mg by mouth daily. 01/02/18   [provider]  meloxicam  (MOBIC ) 15 MG tablet Take 1 tablet daily with food for 10 days. Then take  as needed. 07/17/23   Vita Morrow, MD    Physical Exam: BP (!) 180/86 (BP Location: Left Arm)   Pulse 66   Temp (!) 103.2 F (39.6 C) (Oral)   Resp 18   Wt 98.9 kg   SpO2 97%   BMI 37.42 kg/m  General: Pleasant, acutely ill-appearing obese woman laying in bed. No acute distress. HEENT: /AT. Anicteric sclera. Dry mucous membrane. CV: RRR. No murmurs, rubs, or gallops. No LE edema Pulmonary: Lungs CTAB. Normal effort. No wheezing or rales. Abdominal: Soft, nontender, nondistended.  Left CVA tenderness. Normal bowel sounds. Extremities: Palpable radial  and DP pulses. Normal ROM. Skin: Warm and dry. No obvious rash or lesions. Neuro: A&Ox3. Moves all extremities. Normal sensation to light touch. No focal deficit. Psych: Normal mood and affect          Labs on Admission:  Basic Metabolic Panel: Recent Labs  Lab 08/07/23 1430  NA 128*  K 3.5  CL 96*  CO2 22  GLUCOSE 284*  BUN 27*  CREATININE 1.05*  CALCIUM 8.1*   Liver Function Tests: Recent Labs  Lab 08/07/23 1430  AST 30  ALT 25  ALKPHOS 188*  BILITOT 1.8*  PROT 6.6  ALBUMIN 2.7*   No results for input(s): LIPASE, AMYLASE in the last 168 hours. No results for input(s): AMMONIA in the last 168 hours. CBC: Recent Labs  Lab 08/07/23 1430  WBC 22.2*  NEUTROABS 17.2*  HGB 12.3  HCT 37.0  MCV 92.3  PLT 240   Cardiac Enzymes: No results for input(s): CKTOTAL, CKMB, CKMBINDEX, TROPONINI in the last 168 hours. BNP (last 3 results) No results for input(s): BNP in the last 8760 hours.  ProBNP (last 3 results) No results for input(s): PROBNP in the last 8760 hours.  CBG: Recent Labs  Lab 08/07/23 2046  GLUCAP 195*    Radiological Exams on Admission: CT ABDOMEN PELVIS WO CONTRAST Result Date: 08/07/2023 CLINICAL DATA:  UTI. EXAM: CT ABDOMEN AND PELVIS WITHOUT CONTRAST TECHNIQUE: Multidetector CT imaging of the abdomen and pelvis was performed following the standard protocol without IV contrast. RADIATION DOSE REDUCTION: This exam was performed according to the departmental dose-optimization program which includes automated exposure control, adjustment of the mA and/or kV according to patient size and/or use of iterative reconstruction technique. COMPARISON:  CT dated 01/16/2018. FINDINGS: Evaluation of this exam is limited in the absence of intravenous contrast. Lower chest: Bibasilar subpleural atelectasis. No intra-abdominal free air or free fluid. Hepatobiliary: Fatty liver. No biliary dilatation. Probable minimal sludge or layering small stones  in the gallbladder. No pericholecystic fluid or evidence of acute cholecystitis by CT. Pancreas: Unremarkable. No pancreatic ductal dilatation or surrounding inflammatory changes. Spleen: Normal in size without focal abnormality. Adrenals/Urinary Tract: The adrenal glands unremarkable. Small left renal inter pole fatty lesion similar to prior CT and in keeping with angiomyolipoma. There is no hydronephrosis on physis on either side. The visualized ureters and urinary bladder appear unremarkable. Stomach/Bowel: There is no bowel obstruction or active inflammation. The appendix is normal. Vascular/Lymphatic: Moderate aortoiliac atherosclerotic disease. The IVC is unremarkable. No portal gas. There is no adenopathy. Reproductive: Hysterectomy.  No suspicious adnexal masses. Other: Small fat containing umbilical hernia. Musculoskeletal: Osteopenia with degenerative changes. No acute osseous pathology. IMPRESSION: 1. No acute intra-abdominal or pelvic pathology. No hydronephrosis or nephrolithiasis. 2. Fatty liver. 3.  Aortic Atherosclerosis (ICD10-I70.0). Electronically Signed   By: Vanetta Chou M.D.   On: 08/07/2023 17:36   Assessment/Plan Lelaina Oatis is a  73 y.o. female with medical history significant for type 2 diabetes, depression, arthritis, GERD, HTN and obesity who presented to the ED from the urgent care for evaluation of flank pain, nausea and vomiting and admitted for sepsis secondary to acute pyelonephritis.    # Sepsis # Acute pyelonephritis - Presented with progressive left low back/flank pain, fever, N/V, urinary hesitancy and frequency over the last week - UA shows evidence of urinary infection - Patient with left CVA tenderness on exam - Met sepsis criteria with fever, mild tachycardia, leukocytosis and evidence of urinary infection - Continue IV Rocephin - Start IV hydration with IV NS@100  cc /hr for 10 hours - Follow-up blood and urine cultures - Trend CBC, fever curve  # Type  2 diabetes with hyperglycemia - No recent A1c on file - Blood glucose of 284 on admission - Home regimen includes Lantus 95 units at bedtime and Jardiance 25 mg daily - Start Semglee 95 units at bedtime - Hold Jardiance in the setting of UTI - SSI with meals, CBG monitoring - Carb modified diet - F/u repeat A1c  # Hyponatremia - Sodium of 128 on admission from normal baseline - Pt dry on exam - Likely secondary to hypovolemic hyponatremia - Continue IV NS at 100 cc/h for 10 hours - Check urine sodium and urine osm - F/u repeat sodium  # Hepatic steatosis - Secondary to obesity - Normal LFTs but elevated alk phos and bilirubin - No biliary dilatation or gallbladder pathology on CT - Trend CMP  # HTN - BP elevated with SBP in the 140s to 160s - Continue irbesartan  DVT prophylaxis: Lovenox     Code Status: Full Code  Consults called: None  Family Communication: Discussed admission with son at bedside  Severity of Illness: The appropriate patient status for this patient is INPATIENT. Inpatient status is judged to be reasonable and necessary in order to provide the required intensity of service to ensure the patient's safety. The patient's presenting symptoms, physical exam findings, and initial radiographic and laboratory data in the context of their chronic comorbidities is felt to place them at high risk for further clinical deterioration. Furthermore, it is not anticipated that the patient will be medically stable for discharge from the hospital within 2 midnights of admission.   * I certify that at the point of admission it is my clinical judgment that the patient will require inpatient hospital care spanning beyond 2 midnights from the point of admission due to high intensity of service, high risk for further deterioration and high frequency of surveillance required.*  Level of care: Telemetry   This record has been created using Conservation officer, historic buildings. Errors  have been sought and corrected, but may not always be located. Such creation errors do not reflect on the standard of care.   Lou Claretta HERO, MD 08/07/2023, 9:17 PM [facility] Pager: [phone] Isaiah 41:10   If 7PM-7AM, please contact night-coverage [email or web address] Password [email or web address]

## 2023-08-07 NOTE — ED Notes (Signed)
 Pt unable to urinate at this time.

## 2023-08-07 NOTE — ED Triage Notes (Signed)
 Pt c/o vomiting, fever, weak, and no taste since last Wednesday. States vomit x2 today. Took tylenol  at 7am.  States had a slip and fall on 6/8 and still having lower back pain.  Pt c/o dark urine with a strong odor x1wk.

## 2023-08-07 NOTE — Discharge Instructions (Signed)
 Please go to the nearest emergency room for further workup evaluation.  I am concerned that you may need IV antibiotics due to potential bloodstream infection.

## 2023-08-08 DIAGNOSIS — A419 Sepsis, unspecified organism: Secondary | ICD-10-CM | POA: Insufficient documentation

## 2023-08-08 DIAGNOSIS — E871 Hypo-osmolality and hyponatremia: Secondary | ICD-10-CM

## 2023-08-08 DIAGNOSIS — N10 Acute tubulo-interstitial nephritis: Secondary | ICD-10-CM | POA: Diagnosis not present

## 2023-08-08 LAB — SODIUM, URINE, RANDOM: Sodium, Ur: 20 mmol/L

## 2023-08-08 LAB — OSMOLALITY, URINE: Osmolality, Ur: 395 mosm/kg (ref 300–900)

## 2023-08-08 LAB — COMPREHENSIVE METABOLIC PANEL WITH GFR
ALT: 22 U/L (ref 0–44)
AST: 24 U/L (ref 15–41)
Albumin: 2.3 g/dL — ABNORMAL LOW (ref 3.5–5.0)
Alkaline Phosphatase: 174 U/L — ABNORMAL HIGH (ref 38–126)
Anion gap: 11 (ref 5–15)
BUN: 31 mg/dL — ABNORMAL HIGH (ref 8–23)
CO2: 21 mmol/L — ABNORMAL LOW (ref 22–32)
Calcium: 7.9 mg/dL — ABNORMAL LOW (ref 8.9–10.3)
Chloride: 99 mmol/L (ref 98–111)
Creatinine, Ser: 1.36 mg/dL — ABNORMAL HIGH (ref 0.44–1.00)
GFR, Estimated: 41 mL/min — ABNORMAL LOW (ref >60)
Glucose, Bld: 216 mg/dL — ABNORMAL HIGH (ref 70–99)
Potassium: 4.3 mmol/L (ref 3.5–5.1)
Sodium: 131 mmol/L — ABNORMAL LOW (ref 135–145)
Total Bilirubin: 0.9 mg/dL (ref 0.0–1.2)
Total Protein: 5.5 g/dL — ABNORMAL LOW (ref 6.5–8.1)

## 2023-08-08 LAB — CBC
HCT: 34.5 % — ABNORMAL LOW (ref 36.0–46.0)
Hemoglobin: 11.8 g/dL — ABNORMAL LOW (ref 12.0–15.0)
MCH: 31.1 pg (ref 26.0–34.0)
MCHC: 34.2 g/dL (ref 30.0–36.0)
MCV: 91 fL (ref 80.0–100.0)
Platelets: 229 10*3/uL (ref 150–400)
RBC: 3.79 MIL/uL — ABNORMAL LOW (ref 3.87–5.11)
RDW: 13 % (ref 11.5–15.5)
WBC: 23.7 10*3/uL — ABNORMAL HIGH (ref 4.0–10.5)
nRBC: 0 % (ref 0.0–0.2)

## 2023-08-08 LAB — GLUCOSE, CAPILLARY
Glucose-Capillary: 181 mg/dL — ABNORMAL HIGH (ref 70–99)
Glucose-Capillary: 175 mg/dL — ABNORMAL HIGH (ref 70–99)
Glucose-Capillary: 146 mg/dL — ABNORMAL HIGH (ref 70–99)
Glucose-Capillary: 139 mg/dL — ABNORMAL HIGH (ref 70–99)

## 2023-08-08 LAB — HEMOGLOBIN A1C
Hgb A1c MFr Bld: 12.8 % — ABNORMAL HIGH (ref 4.8–5.6)
Mean Plasma Glucose: 320.66 mg/dL

## 2023-08-08 MED ORDER — HYDROMORPHONE HCL 1 MG/ML IJ SOLN
0.5000 mg | Freq: Four times a day (QID) | INTRAMUSCULAR | Status: DC | PRN
  Administered 2023-08-08: 0.5 mg via INTRAVENOUS
  Filled 2023-08-08: qty 0.5

## 2023-08-08 MED ORDER — HYDRALAZINE HCL 20 MG/ML IJ SOLN: 10.0000 mg | INTRAMUSCULAR | Status: DC | PRN

## 2023-08-08 MED ORDER — IRBESARTAN 150 MG PO TABS
300.0000 mg | ORAL_TABLET | Freq: Every day | ORAL | Status: DC
  Administered 2023-08-08 – 2023-08-12 (×5): 300 mg via ORAL
  Filled 2023-08-08 (×5): qty 2

## 2023-08-08 MED ORDER — ASPIRIN 81 MG PO TBEC
81.0000 mg | DELAYED_RELEASE_TABLET | Freq: Every day | ORAL | Status: DC
  Administered 2023-08-08 – 2023-08-12 (×5): 81 mg via ORAL
  Filled 2023-08-08 (×5): qty 1

## 2023-08-08 MED ORDER — IPRATROPIUM-ALBUTEROL 0.5-2.5 (3) MG/3ML IN SOLN: 3.0000 mL | RESPIRATORY_TRACT | Status: DC | PRN

## 2023-08-08 MED ORDER — METOPROLOL TARTRATE 5 MG/5ML IV SOLN: 5.0000 mg | INTRAVENOUS | Status: DC | PRN

## 2023-08-08 MED ORDER — CYCLOBENZAPRINE HCL 10 MG PO TABS: 10.0000 mg | ORAL_TABLET | Freq: Two times a day (BID) | ORAL | Status: DC | PRN

## 2023-08-08 MED ORDER — SODIUM CHLORIDE 0.9 % IV SOLN: INTRAVENOUS | Status: AC

## 2023-08-08 NOTE — Plan of Care (Signed)

## 2023-08-08 NOTE — Progress Notes (Signed)
 PROGRESS NOTE    Ashley Keith  FMW:[id number] DOB: [birth year] DOA: 08/07/2023 PCP: Regino Slater, MD    Brief Narrative:   73 y.o. female with medical history significant for type 2 diabetes, depression, arthritis, GERD, HTN and obesity who presented to the ED from the urgent care for evaluation of flank pain, nausea and vomiting.   Assessment & Plan:  Principal Problem:   Acute pyelonephritis Active Problems:   Sepsis (HCC)   Hyponatremia     Sepsis secondary to left-sided pyelonephritis Clinical evidence of left-sided pyelonephritis as patient has CVA tenderness.  Follow-up culture data, continue IV fluids, IV antibiotics, further adjust as necessary.  IV fluids -Blood culture sent, no urine culture.  I ordered urine cultures to be sent   Type 2 diabetes with hyperglycemia -A1c ordered - On Semglee 95 units at bedtime, sliding scale and Accu-Cheks    Hyponatremia, improved Likely from dehydration, getting IV fluids   Hepatic steatosis -Chronic follow-up outpatient    HTN -Avapro.  IV as needed  DVT prophylaxis: enoxaparin (LOVENOX) injection 40 mg Start: 08/07/23 2200    Code Status: Full Code Family Communication:   Ongoing management for sepsis secondary to left-sided pyelonephritis    Subjective:  Feels little better today but still has tenderness in her back  Examination:  General exam: Appears calm and comfortable  Respiratory system: Clear to auscultation. Respiratory effort normal. Cardiovascular system: S1 & S2 heard, RRR. No JVD, murmurs, rubs, gallops or clicks. No pedal edema. Gastrointestinal system: cva tenderness. +BS Central nervous system: Alert and oriented. No focal neurological deficits. Extremities: Symmetric 5 x 5 power. Skin: No rashes, lesions or ulcers Psychiatry: Judgement and insight appear normal. Mood & affect appropriate.                Diet Orders (From admission, onward)     Start     Ordered   08/07/23  2301  Diet Carb Modified Fluid consistency: Thin; Room service appropriate? Yes  Diet effective now       Question Answer Comment  Diet-HS Snack? Nothing   Calorie Level Medium 1600-2000   Fluid consistency: Thin   Room service appropriate? Yes      08/07/23 2300            Objective: Vitals:   08/07/23 2223 08/08/23 0241 08/08/23 0555 08/08/23 0831  BP: (!) 129/57 (!) 112/52 127/66 127/66  Pulse: 93 63 60 60  Resp: 20 18 18 18   Temp: (!) 100.9 F (38.3 C) 98.8 F (37.1 C) 98 F (36.7 C) 98 F (36.7 C)  TempSrc: Oral Oral Oral Oral  SpO2: 95% 94% 95%   Weight:    99 kg  Height:    5' 4 (1.626 m)    Intake/Output Summary (Last 24 hours) at 08/08/2023 1226 Last data filed at 08/07/2023 1900 Gross per 24 hour  Intake 100 ml  Output --  Net 100 ml   Filed Weights   08/07/23 1356 08/08/23 0831  Weight: 98.9 kg 99 kg    Scheduled Meds:  aspirin EC  81 mg Oral Daily   enoxaparin (LOVENOX) injection  40 mg Subcutaneous Q24H   insulin aspart  0-15 Units Subcutaneous TID WC   insulin aspart  0-5 Units Subcutaneous QHS   insulin glargine-yfgn  95 Units Subcutaneous QHS   irbesartan  300 mg Oral Daily   Continuous Infusions:  sodium chloride 75 mL/hr at 08/08/23 1018   cefTRIAXone (ROCEPHIN)  IV  Nutritional status     Body mass index is 37.46 kg/m.  Data Reviewed:   CBC: Recent Labs  Lab 08/07/23 1430 08/08/23 0555  WBC 22.2* 23.7*  NEUTROABS 17.2*  --   HGB 12.3 11.8*  HCT 37.0 34.5*  MCV 92.3 91.0  PLT 240 229   Basic Metabolic Panel: Recent Labs  Lab 08/07/23 1430 08/08/23 0555  NA 128* 131*  K 3.5 4.3  CL 96* 99  CO2 22 21*  GLUCOSE 284* 216*  BUN 27* 31*  CREATININE 1.05* 1.36*  CALCIUM 8.1* 7.9*   GFR: Estimated Creatinine Clearance: 41.5 mL/min (A) (by C-G formula based on SCr of 1.36 mg/dL (H)). Liver Function Tests: Recent Labs  Lab 08/07/23 1430 08/08/23 0555  AST 30 24  ALT 25 22  ALKPHOS 188* 174*  BILITOT 1.8*  0.9  PROT 6.6 5.5*  ALBUMIN 2.7* 2.3*   No results for input(s): LIPASE, AMYLASE in the last 168 hours. No results for input(s): AMMONIA in the last 168 hours. Coagulation Profile: No results for input(s): INR, PROTIME in the last 168 hours. Cardiac Enzymes: No results for input(s): CKTOTAL, CKMB, CKMBINDEX, TROPONINI in the last 168 hours. BNP (last 3 results) No results for input(s): PROBNP in the last 8760 hours. HbA1C: Recent Labs    08/08/23 0555  HGBA1C 12.8*   CBG: Recent Labs  Lab 08/07/23 2046 08/08/23 0755 08/08/23 1147  GLUCAP 195* 181* 175*   Lipid Profile: No results for input(s): CHOL, HDL, LDLCALC, TRIG, CHOLHDL, LDLDIRECT in the last 72 hours. Thyroid  Function Tests: No results for input(s): TSH, T4TOTAL, FREET4, T3FREE, THYROIDAB in the last 72 hours. Anemia Panel: No results for input(s): VITAMINB12, FOLATE, FERRITIN, TIBC, IRON, RETICCTPCT in the last 72 hours. Sepsis Labs: Recent Labs  Lab 08/07/23 1628 08/07/23 2310  LATICACIDVEN 1.3 1.4    Recent Results (from the past 240 hours)  Urine Culture     Status: Abnormal (Preliminary result)   Collection Time: 08/07/23  1:12 PM   Specimen: Urine, Clean Catch  Result Value Ref Range Status   Specimen Description URINE, CLEAN CATCH  Final   Special Requests NONE  Final   Culture (A)  Final    >=100,000 COLONIES/mL ESCHERICHIA COLI SUSCEPTIBILITIES TO FOLLOW Performed at [facility], [address] [place], [place] [address]    Report Status PENDING  Incomplete  Blood culture (routine x 2)     Status: None (Preliminary result)   Collection Time: 08/07/23  4:33 PM   Specimen: BLOOD  Result Value Ref Range Status   Specimen Description   Final    BLOOD LEFT ANTECUBITAL Performed at [facility], [address] [place], [place] [address]    Special Requests   Final    BOTTLES DRAWN AEROBIC AND ANAEROBIC Blood  Culture adequate volume Performed at [facility], [address], [place], [place] [address]    Culture   Final    NO GROWTH < 12 HOURS Performed at [facility], [address], [place], [place] [address]    Report Status PENDING  Incomplete  Blood culture (routine x 2)     Status: None (Preliminary result)   Collection Time: 08/07/23 11:10 PM   Specimen: BLOOD  Result Value Ref Range Status   Specimen Description   Final    BLOOD BLOOD LEFT HAND Performed at [facility], [address] [place], [place] [address]    Special Requests  Final    BOTTLES DRAWN AEROBIC ONLY Blood Culture results may not be optimal due to an inadequate volume of blood received in culture bottles Performed at [facility], [address] [place], [place] [address]    Culture   Final    NO GROWTH < 12 HOURS Performed at [facility], [address], [place], [place] [address]    Report Status PENDING  Incomplete         Radiology Studies: CT ABDOMEN PELVIS WO CONTRAST Result Date: 08/07/2023 CLINICAL DATA:  UTI. EXAM: CT ABDOMEN AND PELVIS WITHOUT CONTRAST TECHNIQUE: Multidetector CT imaging of the abdomen and pelvis was performed following the standard protocol without IV contrast. RADIATION DOSE REDUCTION: This exam was performed according to the departmental dose-optimization program which includes automated exposure control, adjustment of the mA and/or kV according to patient size and/or use of iterative reconstruction technique. COMPARISON:  CT dated 01/16/2018. FINDINGS: Evaluation of this exam is limited in the absence of intravenous contrast. Lower chest: Bibasilar subpleural atelectasis. No intra-abdominal free air or free fluid. Hepatobiliary: Fatty liver. No biliary dilatation. Probable minimal sludge or layering small stones in the gallbladder. No pericholecystic fluid or evidence of acute cholecystitis by CT. Pancreas:  Unremarkable. No pancreatic ductal dilatation or surrounding inflammatory changes. Spleen: Normal in size without focal abnormality. Adrenals/Urinary Tract: The adrenal glands unremarkable. Small left renal inter pole fatty lesion similar to prior CT and in keeping with angiomyolipoma. There is no hydronephrosis on physis on either side. The visualized ureters and urinary bladder appear unremarkable. Stomach/Bowel: There is no bowel obstruction or active inflammation. The appendix is normal. Vascular/Lymphatic: Moderate aortoiliac atherosclerotic disease. The IVC is unremarkable. No portal gas. There is no adenopathy. Reproductive: Hysterectomy.  No suspicious adnexal masses. Other: Small fat containing umbilical hernia. Musculoskeletal: Osteopenia with degenerative changes. No acute osseous pathology. IMPRESSION: 1. No acute intra-abdominal or pelvic pathology. No hydronephrosis or nephrolithiasis. 2. Fatty liver. 3.  Aortic Atherosclerosis (ICD10-I70.0). Electronically Signed   By: Vanetta Chou M.D.   On: 08/07/2023 17:36           LOS: 1 day   Time spent= 35 mins    Burgess JAYSON Dare, MD [facility]  If 7PM-7AM, please contact night-coverage  08/08/2023, 12:26 PM

## 2023-08-08 NOTE — Hospital Course (Addendum)
 Brief Narrative:   73 y.o. female with medical history significant for type 2 diabetes, depression, arthritis, GERD, HTN and obesity who presented to the ED from the urgent care for evaluation of flank pain, nausea and vomiting.   Assessment & Plan:  Principal Problem:   Acute pyelonephritis Active Problems:   Sepsis (HCC)   Hyponatremia     Sepsis secondary to left-sided pyelonephritis Clinical evidence of left-sided pyelonephritis as patient has CVA tenderness.  Follow-up culture data, continue IV fluids, IV antibiotics, further adjust as necessary.  IV fluids -Blood culture sent, no urine culture.  I ordered urine cultures to be sent   Type 2 diabetes with hyperglycemia -A1c ordered - On Semglee 95 units at bedtime, sliding scale and Accu-Cheks    Hyponatremia, improved Likely from dehydration, getting IV fluids   Hepatic steatosis -Chronic follow-up outpatient    HTN -Avapro.  IV as needed  DVT prophylaxis: enoxaparin (LOVENOX) injection 40 mg Start: 08/07/23 2200    Code Status: Full Code Family Communication:   Ongoing management for sepsis secondary to left-sided pyelonephritis    Subjective:  Feels little better today but still has tenderness in her back  Examination:  General exam: Appears calm and comfortable  Respiratory system: Clear to auscultation. Respiratory effort normal. Cardiovascular system: S1 & S2 heard, RRR. No JVD, murmurs, rubs, gallops or clicks. No pedal edema. Gastrointestinal system: cva tenderness. +BS Central nervous system: Alert and oriented. No focal neurological deficits. Extremities: Symmetric 5 x 5 power. Skin: No rashes, lesions or ulcers Psychiatry: Judgement and insight appear normal. Mood & affect appropriate.

## 2023-08-08 NOTE — Progress Notes (Signed)
   08/08/23 1003  TOC Brief Assessment  Insurance and Status Reviewed  Patient has primary care physician Yes  Home environment has been reviewed home alone  Prior level of function: independent  Prior/Current Home Services No current home services  Social Drivers of Health Review SDOH reviewed no interventions necessary  Readmission risk has been reviewed Yes  Transition of care needs no transition of care needs at this time

## 2023-08-08 NOTE — Progress Notes (Signed)
   08/08/23 1914  Assess: MEWS Score  Temp (!) 102.5 F (39.2 C)  BP 111/78  MAP (mmHg) 89  Pulse Rate 87  Resp 15  SpO2 94 %  O2 Device Nasal Cannula  Assess: MEWS Score  MEWS Temp 2  MEWS Systolic 0  MEWS Pulse 0  MEWS RR 0  MEWS LOC 0  MEWS Score 2  MEWS Score Color Yellow  Assess: if the MEWS score is Yellow or Red  Were vital signs accurate and taken at a resting state? Yes  Does the patient meet 2 or more of the SIRS criteria? No  MEWS guidelines implemented  Yes, yellow  Treat  MEWS Interventions Considered administering scheduled or prn medications/treatments as ordered  Take Vital Signs  Increase Vital Sign Frequency  Yellow: Q2hr x1, continue Q4hrs until patient remains green for 12hrs  Escalate  MEWS: Escalate Yellow: Discuss with charge nurse and consider notifying provider and/or RRT  Assess: SIRS CRITERIA  SIRS Temperature  1  SIRS Respirations  0  SIRS Pulse 0  SIRS WBC 0  SIRS Score Sum  1   APP on-call notified, Tylenol  given as ordered and ice packs placed on axillary.

## 2023-08-09 ENCOUNTER — Ambulatory Visit (HOSPITAL_COMMUNITY): Payer: Self-pay

## 2023-08-09 DIAGNOSIS — R6889 Other general symptoms and signs: Secondary | ICD-10-CM | POA: Diagnosis not present

## 2023-08-09 DIAGNOSIS — N12 Tubulo-interstitial nephritis, not specified as acute or chronic: Secondary | ICD-10-CM | POA: Diagnosis not present

## 2023-08-09 DIAGNOSIS — N10 Acute tubulo-interstitial nephritis: Secondary | ICD-10-CM | POA: Diagnosis not present

## 2023-08-09 DIAGNOSIS — R7881 Bacteremia: Secondary | ICD-10-CM | POA: Diagnosis present

## 2023-08-09 LAB — GLUCOSE, CAPILLARY
Glucose-Capillary: 99 mg/dL (ref 70–99)
Glucose-Capillary: 112 mg/dL — ABNORMAL HIGH (ref 70–99)
Glucose-Capillary: 113 mg/dL — ABNORMAL HIGH (ref 70–99)
Glucose-Capillary: 100 mg/dL — ABNORMAL HIGH (ref 70–99)
Glucose-Capillary: 100 mg/dL — ABNORMAL HIGH (ref 70–99)

## 2023-08-09 LAB — MAGNESIUM: Magnesium: 2.1 mg/dL (ref 1.7–2.4)

## 2023-08-09 LAB — BLOOD CULTURE ID PANEL (REFLEXED) - BCID2

## 2023-08-09 LAB — BASIC METABOLIC PANEL WITH GFR
Anion gap: 9 (ref 5–15)
BUN: 29 mg/dL — ABNORMAL HIGH (ref 8–23)
CO2: 23 mmol/L (ref 22–32)
Calcium: 8 mg/dL — ABNORMAL LOW (ref 8.9–10.3)
Chloride: 102 mmol/L (ref 98–111)
Creatinine, Ser: 0.96 mg/dL (ref 0.44–1.00)
GFR, Estimated: >60 mL/min (ref >60)
Glucose, Bld: 103 mg/dL — ABNORMAL HIGH (ref 70–99)
Potassium: 3.9 mmol/L (ref 3.5–5.1)
Sodium: 134 mmol/L — ABNORMAL LOW (ref 135–145)

## 2023-08-09 LAB — DIFFERENTIAL
Abs Immature Granulocytes: 0.4 10*3/uL — ABNORMAL HIGH (ref 0.00–0.07)
Basophils Absolute: 0.2 10*3/uL — ABNORMAL HIGH (ref 0.0–0.1)
Basophils Relative: 1 %
Eosinophils Absolute: 0.3 10*3/uL (ref 0.0–0.5)
Eosinophils Relative: 1 %
Immature Granulocytes: 2 %
Lymphocytes Relative: 10 %
Lymphs Abs: 2.6 10*3/uL (ref 0.7–4.0)
Monocytes Absolute: 2.8 10*3/uL — ABNORMAL HIGH (ref 0.1–1.0)
Monocytes Relative: 11 %
Neutro Abs: 19.5 10*3/uL — ABNORMAL HIGH (ref 1.7–7.7)
Neutrophils Relative %: 75 %

## 2023-08-09 LAB — CBC
HCT: 36 % (ref 36.0–46.0)
Hemoglobin: 11.9 g/dL — ABNORMAL LOW (ref 12.0–15.0)
MCH: 31.2 pg (ref 26.0–34.0)
MCHC: 33.1 g/dL (ref 30.0–36.0)
MCV: 94.5 fL (ref 80.0–100.0)
Platelets: 299 10*3/uL (ref 150–400)
RBC: 3.81 MIL/uL — ABNORMAL LOW (ref 3.87–5.11)
RDW: 13.4 % (ref 11.5–15.5)
WBC: 25.4 10*3/uL — ABNORMAL HIGH (ref 4.0–10.5)
nRBC: 0 % (ref 0.0–0.2)

## 2023-08-09 LAB — URINE CULTURE
Culture: >=100000 — AB
Culture: <10000 — AB

## 2023-08-09 LAB — PROCALCITONIN: Procalcitonin: 17.51 ng/mL

## 2023-08-09 MED ORDER — ENOXAPARIN SODIUM 60 MG/0.6ML IJ SOSY
50.0000 mg | PREFILLED_SYRINGE | INTRAMUSCULAR | Status: DC
  Administered 2023-08-09 – 2023-08-11 (×3): 50 mg via SUBCUTANEOUS
  Filled 2023-08-09 (×3): qty 0.6

## 2023-08-09 NOTE — Progress Notes (Signed)
 Mobility Specialist - Progress Note   08/09/23 1323  Mobility  Activity Ambulated with assistance in hallway  Level of Assistance Modified independent, requires aide device or extra time  Assistive Device Other (Comment) (IV Pole)  Distance Ambulated (ft) 250 ft  Activity Response Tolerated well  Mobility Referral Yes  Mobility visit 1 Mobility  Mobility Specialist Start Time (ACUTE ONLY) 1308  Mobility Specialist Stop Time (ACUTE ONLY) 1318  Mobility Specialist Time Calculation (min) (ACUTE ONLY) 10 min   Pt received in bed and agreeable to mobility. No complaints during session. Fatigued with session. Pt to bed after session with all needs met.    [facility]

## 2023-08-09 NOTE — Plan of Care (Signed)
   Problem: Clinical Measurements: Goal: Diagnostic test results will improve Outcome: Progressing   Problem: Activity: Goal: Risk for activity intolerance will decrease Outcome: Progressing   Problem: Nutrition: Goal: Adequate nutrition will be maintained Outcome: Progressing

## 2023-08-09 NOTE — Progress Notes (Signed)
 [facility]                                                                              [place], is a 73 y.o. female, DOB - 05-07-1950, FMW:[id number] Admit date - 08/07/2023    Outpatient Primary MD for the patient is Koirala, Dibas, MD  LOS - 2  days  Chief Complaint  Patient presents with   Vomiting   Back Pain       Brief summary   73 y.o. female with medical history significant for type 2 diabetes, depression, arthritis, GERD, HTN and obesity who presented to the ED from the urgent care for evaluation of flank pain, nausea and vomiting.    Assessment & Plan    Principal Problem:   Acute pyelonephritis, UTI, E. coli bacteremia -Admitted with UTI, left-sided pyelonephritis as patient has CVA tenderness - Sepsis criteria on admission with fevers, tachycardia, leukocytosis, source UTI/pyelonephritis - Urine culture showed more than 100,000 colonies of E. coli, blood cultures positive for E. coli - Continue IV Rocephin - WBC still trending up, 25.4, obtain procalcitonin  Active Problems:    Hyponatremia -Improving  Diabetes mellitus type 2 with hyperglycemia, uncontrolled - Hemoglobin A1c 12.8 CBG (last 3)  Recent Labs    08/08/23 2052 08/09/23 0739 08/09/23 1225  GLUCAP 139* 99 112*   Continue Semglee 95 units daily, NovoLog moderate sliding scale insulin  Hepatic steatosis - Continue outpatient follow-up  Hypertension - Stable, continue Avapro  Obesity class II Estimated body mass index is 37.46 kg/m as calculated from the following:   Height as of this encounter: 5' 4 (1.626 m).   Weight as of this encounter: 99 kg.  Code Status: Full code DVT Prophylaxis:     Level of Care: Level of care: Med-Surg Family Communication: Updated patient Disposition Plan:      Remains inpatient appropriate:   Once medically improving, leukocytosis improving.  Currently ongoing management for sepsis secondary to bacteremia, left-sided  pyelonephritis   Procedures:    Consultants:     Antimicrobials:   Anti-infectives (From admission, onward)    Start     Dose/Rate Route Frequency Ordered Stop   08/08/23 1600  cefTRIAXone (ROCEPHIN) 2 g in sodium chloride 0.9 % 100 mL IVPB        2 g 200 mL/hr over 30 Minutes Intravenous Every 24 hours 08/07/23 2356     08/07/23 1530  cefTRIAXone (ROCEPHIN) 2 g in sodium chloride 0.9 % 100 mL IVPB        2 g 200 mL/hr over 30 Minutes Intravenous  Once 08/07/23 1525 08/07/23 1900          Medications  aspirin EC  81 mg Oral Daily   enoxaparin (LOVENOX) injection  50 mg Subcutaneous Q24H   insulin aspart  0-15 Units Subcutaneous TID WC   insulin aspart  0-5 Units Subcutaneous QHS   insulin glargine-yfgn  95 Units Subcutaneous QHS   irbesartan  300 mg Oral Daily      Subjective:   Ashley Keith was seen and examined today.  Has some tenderness in the back otherwise no fever or chills,  feeling better.  Patient denies dizziness, chest pain, shortness of breath, abdominal pain, N/V. Objective:   Vitals:   08/08/23 2102 08/08/23 2234 08/09/23 0548 08/09/23 1021  BP:  (!) 126/56 139/78 (!) 148/62  Pulse:  66 73 69  Resp:  20 18   Temp: 98.2 F (36.8 C) 98.8 F (37.1 C) 98.8 F (37.1 C)   TempSrc: Oral     SpO2:  100% 95%   Weight:      Height:        Intake/Output Summary (Last 24 hours) at 08/09/2023 1256 Last data filed at 08/09/2023 0418 Gross per 24 hour  Intake 1202.13 ml  Output --  Net 1202.13 ml     Wt Readings from Last 3 Encounters:  08/08/23 99 kg  05/16/22 100.2 kg  05/02/22 100.2 kg     Exam General: Alert and oriented x 3, NAD Cardiovascular: S1 S2 auscultated,  RRR Respiratory: Clear to auscultation bilaterally, no wheezing Gastrointestinal: Soft, nontender, nondistended, + bowel sounds Ext: no pedal edema bilaterally Neuro: no new deficits Psych: Normal affect     Data Reviewed:  I have personally reviewed following labs     CBC Lab Results  Component Value Date   WBC 25.4 (H) 08/09/2023   RBC 3.81 (L) 08/09/2023   HGB 11.9 (L) 08/09/2023   HCT 36.0 08/09/2023   MCV 94.5 08/09/2023   MCH 31.2 08/09/2023   PLT 299 08/09/2023   MCHC 33.1 08/09/2023   RDW 13.4 08/09/2023   LYMPHSABS 1.8 08/07/2023   MONOABS 2.6 (H) 08/07/2023   EOSABS 0.0 08/07/2023   BASOSABS 0.1 08/07/2023     Last metabolic panel Lab Results  Component Value Date   NA 134 (L) 08/09/2023   K 3.9 08/09/2023   CL 102 08/09/2023   CO2 23 08/09/2023   BUN 29 (H) 08/09/2023   CREATININE 0.96 08/09/2023   GLUCOSE 103 (H) 08/09/2023   GFRNONAA >60 08/09/2023   GFRAA >60 01/16/2018   CALCIUM 8.0 (L) 08/09/2023   PROT 5.5 (L) 08/08/2023   ALBUMIN 2.3 (L) 08/08/2023   BILITOT 0.9 08/08/2023   ALKPHOS 174 (H) 08/08/2023   AST 24 08/08/2023   ALT 22 08/08/2023   ANIONGAP 9 08/09/2023    CBG (last 3)  Recent Labs    08/08/23 2052 08/09/23 0739 08/09/23 1225  GLUCAP 139* 99 112*      Coagulation Profile: No results for input(s): INR, PROTIME in the last 168 hours.   Radiology Studies: I have personally reviewed the imaging studies  CT ABDOMEN PELVIS WO CONTRAST Result Date: 08/07/2023 CLINICAL DATA:  UTI. EXAM: CT ABDOMEN AND PELVIS WITHOUT CONTRAST TECHNIQUE: Multidetector CT imaging of the abdomen and pelvis was performed following the standard protocol without IV contrast. RADIATION DOSE REDUCTION: This exam was performed according to the departmental dose-optimization program which includes automated exposure control, adjustment of the mA and/or kV according to patient size and/or use of iterative reconstruction technique. COMPARISON:  CT dated 01/16/2018. FINDINGS: Evaluation of this exam is limited in the absence of intravenous contrast. Lower chest: Bibasilar subpleural atelectasis. No intra-abdominal free air or free fluid. Hepatobiliary: Fatty liver. No biliary dilatation. Probable minimal sludge or layering  small stones in the gallbladder. No pericholecystic fluid or evidence of acute cholecystitis by CT. Pancreas: Unremarkable. No pancreatic ductal dilatation or surrounding inflammatory changes. Spleen: Normal in size without focal abnormality. Adrenals/Urinary Tract: The adrenal glands unremarkable. Small left renal inter pole fatty lesion similar to prior CT and in  keeping with angiomyolipoma. There is no hydronephrosis on physis on either side. The visualized ureters and urinary bladder appear unremarkable. Stomach/Bowel: There is no bowel obstruction or active inflammation. The appendix is normal. Vascular/Lymphatic: Moderate aortoiliac atherosclerotic disease. The IVC is unremarkable. No portal gas. There is no adenopathy. Reproductive: Hysterectomy.  No suspicious adnexal masses. Other: Small fat containing umbilical hernia. Musculoskeletal: Osteopenia with degenerative changes. No acute osseous pathology. IMPRESSION: 1. No acute intra-abdominal or pelvic pathology. No hydronephrosis or nephrolithiasis. 2. Fatty liver. 3.  Aortic Atherosclerosis (ICD10-I70.0). Electronically Signed   By: Vanetta Chou M.D.   On: 08/07/2023 17:36       Denisia Harpole M.D. [facility] 08/09/2023, 12:56 PM  Available via Epic secure chat 7am-7pm After 7 pm, please refer to night coverage provider listed on amion.

## 2023-08-09 NOTE — Progress Notes (Signed)
 PHARMACY - PHYSICIAN COMMUNICATION CRITICAL VALUE ALERT - BLOOD CULTURE IDENTIFICATION (BCID)  Ashley Keith is an 73 y.o. female who presented to [facility] on 08/07/2023 with a chief complaint of fever, vomiting, low back pain.   Assessment:  Admit with pyelonephritis.  BCID + Ecoli, no resistance.  Name of physician (or Provider) Contacted: JINNY Kipper, NP  Current antibiotics: Rocephin 2gm IV q24h  Changes to prescribed antibiotics recommended:  Patient is on recommended antibiotics - No changes needed  Results for orders placed or performed during the hospital encounter of 08/07/23  Blood Culture ID Panel (Reflexed) (Collected: 08/07/2023  4:33 PM)  Result Value Ref Range   Enterococcus faecalis NOT DETECTED NOT DETECTED   Enterococcus Faecium NOT DETECTED NOT DETECTED   Listeria monocytogenes NOT DETECTED NOT DETECTED   Staphylococcus species NOT DETECTED NOT DETECTED   Staphylococcus aureus (BCID) NOT DETECTED NOT DETECTED   Staphylococcus epidermidis NOT DETECTED NOT DETECTED   Staphylococcus lugdunensis NOT DETECTED NOT DETECTED   Streptococcus species NOT DETECTED NOT DETECTED   Streptococcus agalactiae NOT DETECTED NOT DETECTED   Streptococcus pneumoniae NOT DETECTED NOT DETECTED   Streptococcus pyogenes NOT DETECTED NOT DETECTED   A.calcoaceticus-baumannii NOT DETECTED NOT DETECTED   Bacteroides fragilis NOT DETECTED NOT DETECTED   Enterobacterales DETECTED (A) NOT DETECTED   Enterobacter cloacae complex NOT DETECTED NOT DETECTED   Escherichia coli DETECTED (A) NOT DETECTED   Klebsiella aerogenes NOT DETECTED NOT DETECTED   Klebsiella oxytoca NOT DETECTED NOT DETECTED   Klebsiella pneumoniae NOT DETECTED NOT DETECTED   Proteus species NOT DETECTED NOT DETECTED   Salmonella species NOT DETECTED NOT DETECTED   Serratia marcescens NOT DETECTED NOT DETECTED   Haemophilus influenzae NOT DETECTED NOT DETECTED   Neisseria meningitidis NOT DETECTED NOT DETECTED    Pseudomonas aeruginosa NOT DETECTED NOT DETECTED   Stenotrophomonas maltophilia NOT DETECTED NOT DETECTED   Candida albicans NOT DETECTED NOT DETECTED   Candida auris NOT DETECTED NOT DETECTED   Candida glabrata NOT DETECTED NOT DETECTED   Candida krusei NOT DETECTED NOT DETECTED   Candida parapsilosis NOT DETECTED NOT DETECTED   Candida tropicalis NOT DETECTED NOT DETECTED   Cryptococcus neoformans/gattii NOT DETECTED NOT DETECTED   CTX-M ESBL NOT DETECTED NOT DETECTED   Carbapenem resistance IMP NOT DETECTED NOT DETECTED   Carbapenem resistance KPC NOT DETECTED NOT DETECTED   Carbapenem resistance NDM NOT DETECTED NOT DETECTED   Carbapenem resist OXA 48 LIKE NOT DETECTED NOT DETECTED   Carbapenem resistance VIM NOT DETECTED NOT DETECTED    Rosaline Millet PharmD 08/09/2023  12:24 AM

## 2023-08-10 DIAGNOSIS — R7881 Bacteremia: Secondary | ICD-10-CM | POA: Diagnosis not present

## 2023-08-10 DIAGNOSIS — E871 Hypo-osmolality and hyponatremia: Secondary | ICD-10-CM | POA: Diagnosis not present

## 2023-08-10 DIAGNOSIS — N10 Acute tubulo-interstitial nephritis: Secondary | ICD-10-CM | POA: Diagnosis not present

## 2023-08-10 LAB — CBC
HCT: 35.8 % — ABNORMAL LOW (ref 36.0–46.0)
Hemoglobin: 11.6 g/dL — ABNORMAL LOW (ref 12.0–15.0)
MCH: 30 pg (ref 26.0–34.0)
MCHC: 32.4 g/dL (ref 30.0–36.0)
MCV: 92.5 fL (ref 80.0–100.0)
Platelets: 338 10*3/uL (ref 150–400)
RBC: 3.87 MIL/uL (ref 3.87–5.11)
RDW: 13.4 % (ref 11.5–15.5)
WBC: 25.7 10*3/uL — ABNORMAL HIGH (ref 4.0–10.5)
nRBC: 0 % (ref 0.0–0.2)

## 2023-08-10 LAB — MAGNESIUM: Magnesium: 2 mg/dL (ref 1.7–2.4)

## 2023-08-10 LAB — GLUCOSE, CAPILLARY
Glucose-Capillary: 68 mg/dL — ABNORMAL LOW (ref 70–99)
Glucose-Capillary: 77 mg/dL (ref 70–99)
Glucose-Capillary: 76 mg/dL (ref 70–99)
Glucose-Capillary: 72 mg/dL (ref 70–99)
Glucose-Capillary: 98 mg/dL (ref 70–99)

## 2023-08-10 LAB — BASIC METABOLIC PANEL WITH GFR
Anion gap: 8 (ref 5–15)
BUN: 16 mg/dL (ref 8–23)
CO2: 23 mmol/L (ref 22–32)
Calcium: 8 mg/dL — ABNORMAL LOW (ref 8.9–10.3)
Chloride: 105 mmol/L (ref 98–111)
Creatinine, Ser: 0.53 mg/dL (ref 0.44–1.00)
GFR, Estimated: >60 mL/min (ref >60)
Glucose, Bld: 62 mg/dL — ABNORMAL LOW (ref 70–99)
Potassium: 3.5 mmol/L (ref 3.5–5.1)
Sodium: 136 mmol/L (ref 135–145)

## 2023-08-10 LAB — CULTURE, BLOOD (ROUTINE X 2): Special Requests: ADEQUATE

## 2023-08-10 MED ORDER — POLYETHYLENE GLYCOL 3350 17 G PO PACK
17.0000 g | PACK | Freq: Every day | ORAL | Status: DC
  Administered 2023-08-10 – 2023-08-11 (×2): 17 g via ORAL
  Filled 2023-08-10 (×2): qty 1

## 2023-08-10 MED ORDER — SENNOSIDES-DOCUSATE SODIUM 8.6-50 MG PO TABS
1.0000 | ORAL_TABLET | Freq: Two times a day (BID) | ORAL | Status: DC
  Administered 2023-08-10 – 2023-08-12 (×3): 1 via ORAL
  Filled 2023-08-10 (×4): qty 1

## 2023-08-10 NOTE — Progress Notes (Signed)
 [facility]  PROGRESS NOTE  Ashley Keith FMW:[id number] DOB: 04/24/1950 DOA: 08/07/2023 PCP: Regino Slater, MD   Brief HPI:      73 y.o. female with medical history significant for type 2 diabetes, depression, arthritis, GERD, HTN and obesity who presented to the ED from the urgent care for evaluation of flank pain, nausea and vomiting.    Assessment/Plan:    Acute pyelonephritis, UTI, E. coli bacteremia -Admitted with UTI, left-sided pyelonephritis as patient has CVA tenderness - Sepsis criteria on admission with fevers, tachycardia, leukocytosis, source UTI/pyelonephritis -Blood cultures growing E. coli, greater than 100,000 colonies per mL - Continue IV Rocephin - WBC still trending up, 25.7  - Follow CBC in a.m.   Active Problems:     Hyponatremia Resolved   Diabetes mellitus type 2 with hyperglycemia, uncontrolled - Hemoglobin A1c 12.8  -Continue Semglee 95 units daily, NovoLog moderate sliding scale insulin   Hepatic steatosis - Continue outpatient follow-up   Hypertension - Stable, continue Avapro   Obesity class II Estimated body mass index is 37.46 kg/m as calculated from the following:   Height as of this encounter: 5' 4 (1.626 m).   Weight as of this encounter: 99 kg.   Code Status: Full code    Medications     aspirin EC  81 mg Oral Daily   enoxaparin (LOVENOX) injection  50 mg Subcutaneous Q24H   insulin aspart  0-15 Units Subcutaneous TID WC   insulin aspart  0-5 Units Subcutaneous QHS   insulin glargine-yfgn  95 Units Subcutaneous QHS   irbesartan  300 mg Oral Daily     Data Reviewed:   CBG:  Recent Labs  Lab 08/09/23 1634 08/09/23 2031 08/09/23 2141 08/10/23 0740 08/10/23 0803  GLUCAP 113* 100* 100* 68* 77    SpO2: 100 %    Vitals:   08/09/23 1903 08/09/23 2012 08/10/23 0444 08/10/23 0752  BP: (!) 186/85 (!) 178/68 (!) 180/75 (!) 153/64  Pulse: 70 71 78 68  Resp: 20 20 18 20   Temp: 98.4 F (36.9 C)  98 F (36.7  C) 98.2 F (36.8 C)  TempSrc: Oral  Oral Oral  SpO2: 97% 97% 97% 100%  Weight:      Height:          Data Reviewed:  Basic Metabolic Panel: Recent Labs  Lab 08/07/23 1430 08/08/23 0555 08/09/23 0555 08/10/23 0521  NA 128* 131* 134* 136  K 3.5 4.3 3.9 3.5  CL 96* 99 102 105  CO2 22 21* 23 23  GLUCOSE 284* 216* 103* 62*  BUN 27* 31* 29* 16  CREATININE 1.05* 1.36* 0.96 0.53  CALCIUM 8.1* 7.9* 8.0* 8.0*  MG  --   --  2.1 2.0    CBC: Recent Labs  Lab 08/07/23 1430 08/08/23 0555 08/09/23 0555 08/10/23 0521  WBC 22.2* 23.7* 25.4* 25.7*  NEUTROABS 17.2*  --  19.5*  --   HGB 12.3 11.8* 11.9* 11.6*  HCT 37.0 34.5* 36.0 35.8*  MCV 92.3 91.0 94.5 92.5  PLT 240 229 299 338    LFT Recent Labs  Lab 08/07/23 1430 08/08/23 0555  AST 30 24  ALT 25 22  ALKPHOS 188* 174*  BILITOT 1.8* 0.9  PROT 6.6 5.5*  ALBUMIN 2.7* 2.3*     Antibiotics: Anti-infectives (From admission, onward)    Start     Dose/Rate Route Frequency Ordered Stop   08/08/23 1600  cefTRIAXone (ROCEPHIN) 2 g in sodium chloride 0.9 % 100 mL IVPB  2 g 200 mL/hr over 30 Minutes Intravenous Every 24 hours 08/07/23 2356     08/07/23 1530  cefTRIAXone (ROCEPHIN) 2 g in sodium chloride 0.9 % 100 mL IVPB        2 g 200 mL/hr over 30 Minutes Intravenous  Once 08/07/23 1525 08/07/23 1900        DVT prophylaxis: Lovenox  Code Status: Full code  Family Communication: No family at bedside   CONSULTS    Subjective   Feels better today   Objective    Physical Examination:   General-appears in no acute distress Heart-S1-S2, regular, no murmur auscultated Lungs-clear to auscultation bilaterally, no wheezing or crackles auscultated Abdomen-soft, nontender, no organomegaly Extremities-no edema in the lower extremities Neuro-alert, oriented x3, no focal deficit noted   Status is: Inpatient:             Sabas GORMAN Brod   [facility] If 7PM-7AM, please contact  night-coverage at [email or web address], Office  [phone]   08/10/2023, 8:45 AM  LOS: 3 days

## 2023-08-10 NOTE — Plan of Care (Signed)
   Problem: Nutrition: Goal: Adequate nutrition will be maintained Outcome: Progressing   Problem: Pain Managment: Goal: General experience of comfort will improve and/or be controlled Outcome: Progressing   Problem: Safety: Goal: Ability to remain free from injury will improve Outcome: Progressing

## 2023-08-10 NOTE — Progress Notes (Signed)
 Mobility Specialist - Progress Note   08/10/23 1236  Mobility  Activity Ambulated with assistance in hallway  Level of Assistance Modified independent, requires aide device or extra time  Assistive Device Front wheel walker  Distance Ambulated (ft) 250 ft  Activity Response Tolerated well  Mobility Referral Yes  Mobility visit 1 Mobility  Mobility Specialist Start Time (ACUTE ONLY) 1227  Mobility Specialist Stop Time (ACUTE ONLY) 1235  Mobility Specialist Time Calculation (min) (ACUTE ONLY) 8 min   Pt received in bed and agreeable to mobility. No complaints during session. Pt to bed after session with all needs met.    [facility]

## 2023-08-10 NOTE — Inpatient Diabetes Management (Signed)
 Inpatient Diabetes Program Recommendations  AACE/ADA: New Consensus Statement on Inpatient Glycemic Control (2015)  Target Ranges:  Prepandial:   less than 140 mg/dL      Peak postprandial:   less than 180 mg/dL (1-2 hours)      Critically ill patients:  140 - 180 mg/dL   Lab Results  Component Value Date   GLUCAP 77 08/10/2023   HGBA1C 12.8 (H) 08/08/2023    Review of Glycemic Control  Diabetes history: DM2 Outpatient Diabetes medications: Lantus 95 at bedtime, Jardiance 25 daily (not taking) Current orders for Inpatient glycemic control: Semglee 95 at bedtime, Novolog 0-15 TID with meals and 0-5 HS  HgbA1C - 12.8% Hypo this am of 62, 68  Inpatient Diabetes Program Recommendations:    Please consider:  Lowering Semglee to 85 units at bedtime  Would add Novolog 0-15 TID for home.  Spoke with pt at bedside regarding her DM home meds. States she takes Lantus 95 units at bedtime. Does not take any rapid-acting insulin. Discussed adding rapid-acting insulin, such as s/s to home regimen and this seemed to be acceptable to pt. Said she does not miss insulin doses. Does have some hypos occasionally in am.   Discussed glucose and A1C goals. Discussed importance of checking CBGs and maintaining good CBG control to prevent long-term and short-term complications. Explained how hyperglycemia leads to damage within blood vessels which lead to the common complications seen with uncontrolled diabetes. Stressed to the patient the importance of improving glycemic control to prevent further complications from uncontrolled diabetes. Discussed impact of nutrition, exercise, stress, sickness, and medications on diabetes control.  Discussed carbohydrates and portion control.  Continue to follow while inpatient. Will check back with pt for any questions or concerns.   Thank you. Shona Brandy, RD, LDN, CDC[facility] Inpatient Diabetes Coordinator [phone]

## 2023-08-11 DIAGNOSIS — N10 Acute tubulo-interstitial nephritis: Secondary | ICD-10-CM | POA: Diagnosis not present

## 2023-08-11 DIAGNOSIS — R7881 Bacteremia: Secondary | ICD-10-CM | POA: Diagnosis not present

## 2023-08-11 DIAGNOSIS — E871 Hypo-osmolality and hyponatremia: Secondary | ICD-10-CM | POA: Diagnosis not present

## 2023-08-11 LAB — BASIC METABOLIC PANEL WITH GFR
Anion gap: 13 (ref 5–15)
BUN: 8 mg/dL (ref 8–23)
CO2: 22 mmol/L (ref 22–32)
Calcium: 7.9 mg/dL — ABNORMAL LOW (ref 8.9–10.3)
Chloride: 98 mmol/L (ref 98–111)
Creatinine, Ser: 0.63 mg/dL (ref 0.44–1.00)
GFR, Estimated: >60 mL/min (ref >60)
Glucose, Bld: 56 mg/dL — ABNORMAL LOW (ref 70–99)
Potassium: 3.3 mmol/L — ABNORMAL LOW (ref 3.5–5.1)
Sodium: 133 mmol/L — ABNORMAL LOW (ref 135–145)

## 2023-08-11 LAB — GLUCOSE, CAPILLARY
Glucose-Capillary: 75 mg/dL (ref 70–99)
Glucose-Capillary: 83 mg/dL (ref 70–99)
Glucose-Capillary: 105 mg/dL — ABNORMAL HIGH (ref 70–99)
Glucose-Capillary: 62 mg/dL — ABNORMAL LOW (ref 70–99)

## 2023-08-11 LAB — CBC
HCT: 33.8 % — ABNORMAL LOW (ref 36.0–46.0)
Hemoglobin: 11.4 g/dL — ABNORMAL LOW (ref 12.0–15.0)
MCH: 30.6 pg (ref 26.0–34.0)
MCHC: 33.7 g/dL (ref 30.0–36.0)
MCV: 90.9 fL (ref 80.0–100.0)
Platelets: 380 K/uL (ref 150–400)
RBC: 3.72 MIL/uL — ABNORMAL LOW (ref 3.87–5.11)
RDW: 13.3 % (ref 11.5–15.5)
WBC: 23.5 K/uL — ABNORMAL HIGH (ref 4.0–10.5)
nRBC: 0 % (ref 0.0–0.2)

## 2023-08-11 LAB — MAGNESIUM: Magnesium: 2 mg/dL (ref 1.7–2.4)

## 2023-08-11 MED ORDER — POTASSIUM CHLORIDE CRYS ER 20 MEQ PO TBCR
40.0000 meq | EXTENDED_RELEASE_TABLET | Freq: Once | ORAL | Status: AC
  Administered 2023-08-11: 40 meq via ORAL
  Filled 2023-08-11: qty 2

## 2023-08-11 MED ORDER — CEFAZOLIN SODIUM-DEXTROSE 2-4 GM/100ML-% IV SOLN
2.0000 g | Freq: Three times a day (TID) | INTRAVENOUS | Status: DC
  Administered 2023-08-11 – 2023-08-12 (×3): 2 g via INTRAVENOUS
  Filled 2023-08-11 (×3): qty 100

## 2023-08-11 NOTE — Plan of Care (Signed)
 No acute events overnight. Problem: Education: Goal: Knowledge of General Education information will improve Description: Including pain rating scale, medication(s)/side effects and non-pharmacologic comfort measures Outcome: Progressing   Problem: Health Behavior/Discharge Planning: Goal: Ability to manage health-related needs will improve Outcome: Progressing   Problem: Clinical Measurements: Goal: Ability to maintain clinical measurements within normal limits will improve Outcome: Progressing Goal: Will remain free from infection Outcome: Progressing Goal: Diagnostic test results will improve Outcome: Progressing Goal: Respiratory complications will improve Outcome: Progressing Goal: Cardiovascular complication will be avoided Outcome: Progressing   Problem: Activity: Goal: Risk for activity intolerance will decrease Outcome: Progressing   Problem: Nutrition: Goal: Adequate nutrition will be maintained Outcome: Progressing   Problem: Coping: Goal: Level of anxiety will decrease Outcome: Progressing   Problem: Elimination: Goal: Will not experience complications related to bowel motility Outcome: Progressing Goal: Will not experience complications related to urinary retention Outcome: Progressing   Problem: Pain Managment: Goal: General experience of comfort will improve and/or be controlled Outcome: Progressing   Problem: Safety: Goal: Ability to remain free from injury will improve Outcome: Progressing   Problem: Skin Integrity: Goal: Risk for impaired skin integrity will decrease Outcome: Progressing   Problem: Education: Goal: Ability to describe self-care measures that may prevent or decrease complications (Diabetes Survival Skills Education) will improve Outcome: Progressing Goal: Individualized Educational Video(s) Outcome: Progressing   Problem: Coping: Goal: Ability to adjust to condition or change in health will improve Outcome: Progressing    Problem: Fluid Volume: Goal: Ability to maintain a balanced intake and output will improve Outcome: Progressing   Problem: Health Behavior/Discharge Planning: Goal: Ability to identify and utilize available resources and services will improve Outcome: Progressing Goal: Ability to manage health-related needs will improve Outcome: Progressing   Problem: Metabolic: Goal: Ability to maintain appropriate glucose levels will improve Outcome: Progressing   Problem: Nutritional: Goal: Maintenance of adequate nutrition will improve Outcome: Progressing Goal: Progress toward achieving an optimal weight will improve Outcome: Progressing   Problem: Skin Integrity: Goal: Risk for impaired skin integrity will decrease Outcome: Progressing   Problem: Tissue Perfusion: Goal: Adequacy of tissue perfusion will improve Outcome: Progressing

## 2023-08-11 NOTE — Plan of Care (Signed)
 Alert and oriented.  CBGs remained within range, did not require insulin  coverage.  Continues on IV antibiotics.   Problem: Education: Goal: Knowledge of General Education information will improve Description: Including pain rating scale, medication(s)/side effects and non-pharmacologic comfort measures Outcome: Progressing   Problem: Health Behavior/Discharge Planning: Goal: Ability to manage health-related needs will improve Outcome: Progressing   Problem: Clinical Measurements: Goal: Ability to maintain clinical measurements within normal limits will improve Outcome: Progressing Goal: Will remain free from infection Outcome: Progressing Goal: Diagnostic test results will improve Outcome: Progressing Goal: Respiratory complications will improve Outcome: Progressing

## 2023-08-11 NOTE — Progress Notes (Signed)
 [facility]  PROGRESS NOTE  Ashley Keith FMW:[id number] DOB: [birth year] DOA: 08/07/2023 PCP: Regino Slater, MD   Brief HPI:      73 y.o. female with medical history significant for type 2 diabetes, depression, arthritis, GERD, HTN and obesity who presented to the ED from the urgent care for evaluation of flank pain, nausea and vomiting.    Assessment/Plan:    Acute pyelonephritis, UTI, E. coli bacteremia -Admitted with UTI, left-sided pyelonephritis as patient has CVA tenderness - Sepsis criteria on admission with fevers, tachycardia, leukocytosis, source UTI/pyelonephritis -Blood cultures growing E. coli, greater than 100,000 colonies per mL -Urine culture growing greater than 100,000 colonies per mL of E. coli - Will switch from IV Rocephin  to Ancef  2 g IV every 8 hours, discussed with ID - WBC still elevated at 23,000 - Follow CBC in a.m.   Active Problems:     Hyponatremia Resolved   Diabetes mellitus type 2 with hyperglycemia, uncontrolled - Hemoglobin A1c 12.8  -Continue Semglee  95 units daily, NovoLog  moderate sliding scale insulin    Hepatic steatosis - Continue outpatient follow-up   Hypertension - Stable, continue Avapro   Hypokalemia Potassium is 3.3 -Follow BMP in a.m.   Obesity class II Estimated body mass index is 37.46 kg/m as calculated from the following:   Height as of this encounter: 5' 4 (1.626 m).   Weight as of this encounter: 99 kg.   Code Status: Full code    Medications     aspirin  EC  81 mg Oral Daily   enoxaparin  (LOVENOX ) injection  50 mg Subcutaneous Q24H   insulin  aspart  0-15 Units Subcutaneous TID WC   insulin  aspart  0-5 Units Subcutaneous QHS   insulin  glargine-yfgn  95 Units Subcutaneous QHS   irbesartan   300 mg Oral Daily   polyethylene glycol  17 g Oral Daily   senna-docusate  1 tablet Oral BID     Data Reviewed:   CBG:  Recent Labs  Lab 08/10/23 0803 08/10/23 1208 08/10/23 1720 08/10/23 2056  08/11/23 0721  GLUCAP 77 76 72 98 75    SpO2: 97 %    Vitals:   08/10/23 0752 08/10/23 1213 08/10/23 1904 08/11/23 0530  BP: (!) 153/64 (!) 151/64 (!) 163/78 (!) 155/79  Pulse: 68 64 73 66  Resp: 20 18 15 20   Temp: 98.2 F (36.8 C) 99.1 F (37.3 C) 99.3 F (37.4 C) 98.2 F (36.8 C)  TempSrc: Oral Oral    SpO2: 100% 98% 96% 97%  Weight:      Height:          Data Reviewed:  Basic Metabolic Panel: Recent Labs  Lab 08/07/23 1430 08/08/23 0555 08/09/23 0555 08/10/23 0521 08/11/23 0523  NA 128* 131* 134* 136 133*  K 3.5 4.3 3.9 3.5 3.3*  CL 96* 99 102 105 98  CO2 22 21* 23 23 22   GLUCOSE 284* 216* 103* 62* 56*  BUN 27* 31* 29* 16 8  CREATININE 1.05* 1.36* 0.96 0.53 0.63  CALCIUM 8.1* 7.9* 8.0* 8.0* 7.9*  MG  --   --  2.1 2.0 2.0    CBC: Recent Labs  Lab 08/07/23 1430 08/08/23 0555 08/09/23 0555 08/10/23 0521 08/11/23 0523  WBC 22.2* 23.7* 25.4* 25.7* 23.5*  NEUTROABS 17.2*  --  19.5*  --   --   HGB 12.3 11.8* 11.9* 11.6* 11.4*  HCT 37.0 34.5* 36.0 35.8* 33.8*  MCV 92.3 91.0 94.5 92.5 90.9  PLT 240 229 299 338 380  LFT Recent Labs  Lab 08/07/23 1430 08/08/23 0555  AST 30 24  ALT 25 22  ALKPHOS 188* 174*  BILITOT 1.8* 0.9  PROT 6.6 5.5*  ALBUMIN 2.7* 2.3*     Antibiotics: Anti-infectives (From admission, onward)    Start     Dose/Rate Route Frequency Ordered Stop   08/08/23 1600  cefTRIAXone  (ROCEPHIN ) 2 g in sodium chloride  0.9 % 100 mL IVPB        2 g 200 mL/hr over 30 Minutes Intravenous Every 24 hours 08/07/23 2356     08/07/23 1530  cefTRIAXone  (ROCEPHIN ) 2 g in sodium chloride  0.9 % 100 mL IVPB        2 g 200 mL/hr over 30 Minutes Intravenous  Once 08/07/23 1525 08/07/23 1900        DVT prophylaxis: Lovenox   Code Status: Full code  Family Communication: No family at bedside   CONSULTS    Subjective   Feels better today   Objective    Physical Examination:   General-appears in no acute  distress Heart-S1-S2, regular, no murmur auscultated Lungs-clear to auscultation bilaterally, no wheezing or crackles auscultated Abdomen-soft, nontender, no organomegaly Extremities-no edema in the lower extremities Neuro-alert, oriented x3, no focal deficit noted   Status is: Inpatient:             Ashley Keith Brod   [facility] If 7PM-7AM, please contact night-coverage at [email or web address], Office  [phone]   08/11/2023, 8:46 AM  LOS: 4 days

## 2023-08-12 DIAGNOSIS — R6889 Other general symptoms and signs: Secondary | ICD-10-CM | POA: Diagnosis not present

## 2023-08-12 DIAGNOSIS — N12 Tubulo-interstitial nephritis, not specified as acute or chronic: Secondary | ICD-10-CM | POA: Diagnosis not present

## 2023-08-12 DIAGNOSIS — E871 Hypo-osmolality and hyponatremia: Secondary | ICD-10-CM | POA: Diagnosis not present

## 2023-08-12 DIAGNOSIS — N10 Acute tubulo-interstitial nephritis: Secondary | ICD-10-CM | POA: Diagnosis not present

## 2023-08-12 LAB — CBC
HCT: 36.1 % (ref 36.0–46.0)
Hemoglobin: 12.2 g/dL (ref 12.0–15.0)
MCH: 30.8 pg (ref 26.0–34.0)
MCHC: 33.8 g/dL (ref 30.0–36.0)
MCV: 91.2 fL (ref 80.0–100.0)
Platelets: 416 K/uL — ABNORMAL HIGH (ref 150–400)
RBC: 3.96 MIL/uL (ref 3.87–5.11)
RDW: 13.6 % (ref 11.5–15.5)
WBC: 22.2 K/uL — ABNORMAL HIGH (ref 4.0–10.5)
nRBC: 0 % (ref 0.0–0.2)

## 2023-08-12 LAB — GLUCOSE, CAPILLARY: Glucose-Capillary: 80 mg/dL (ref 70–99)

## 2023-08-12 LAB — BASIC METABOLIC PANEL WITH GFR
Anion gap: 10 (ref 5–15)
BUN: 6 mg/dL — ABNORMAL LOW (ref 8–23)
CO2: 26 mmol/L (ref 22–32)
Calcium: 8.3 mg/dL — ABNORMAL LOW (ref 8.9–10.3)
Chloride: 101 mmol/L (ref 98–111)
Creatinine, Ser: 0.55 mg/dL (ref 0.44–1.00)
GFR, Estimated: >60 mL/min (ref >60)
Glucose, Bld: 71 mg/dL (ref 70–99)
Potassium: 4.4 mmol/L (ref 3.5–5.1)
Sodium: 137 mmol/L (ref 135–145)

## 2023-08-12 LAB — MAGNESIUM: Magnesium: 1.9 mg/dL (ref 1.7–2.4)

## 2023-08-12 MED ORDER — INSULIN GLARGINE 100 UNIT/ML ~~LOC~~ SOLN: 95.0000 [IU] | Freq: Every day | SUBCUTANEOUS | 3 refills | Status: AC

## 2023-08-12 MED ORDER — AMLODIPINE BESYLATE 10 MG PO TABS: 10.0000 mg | ORAL_TABLET | Freq: Every day | ORAL | 1 refills | Status: AC

## 2023-08-12 MED ORDER — CEPHALEXIN 500 MG PO CAPS
500.0000 mg | ORAL_CAPSULE | Freq: Four times a day (QID) | ORAL | 0 refills | Status: AC
Start: 2023-08-12 — End: 2023-08-19

## 2023-08-12 NOTE — Plan of Care (Signed)

## 2023-08-12 NOTE — Discharge Summary (Addendum)
 Physician Discharge Summary   Patient: Ashley Keith MRN: [id number] DOB: 10/18/1950  Admit date:     08/07/2023  Discharge date: 08/12/23  Discharge Physician: Ashley Keith   PCP: Ashley Slater, MD   Recommendations at discharge:   Follow-up PCP in 1 week Check CBC in 1 week at PCP office for persistent leukocytosis  Discharge Diagnoses: Principal Problem:   Acute pyelonephritis Active Problems:   Obesity, unspecified   Sepsis (HCC)   Hyponatremia   Bacteremia  Resolved Problems:   * No resolved hospital problems. *  Hospital Course:    Brief HPI 73 y.o. female with medical history significant for type 2 diabetes, depression, arthritis, GERD, HTN and obesity who presented to the ED from the urgent care for evaluation of flank pain, nausea and vomiting.    Hospital course Acute pyelonephritis, UTI, E. coli bacteremia -Admitted with UTI, left-sided pyelonephritis as patient has CVA tenderness - Sepsis criteria on admission with fevers, tachycardia, leukocytosis, source UTI/pyelonephritis -Blood cultures growing E. coli, greater than 100,000 colonies per mL -Urine culture growing greater than 100,000 colonies per mL of E. coli - Will switch from IV Rocephin  to Ancef  2 g IV every 8 hours, discussed with ID - WBC still elevated at 22,000 - Will discharge patient on Keflex  500 mg p.o. 4 times daily for 7 days -Need to follow-up with PCP to check CBC in 1 week for persistent leukocytosis   Active Problems:     Hyponatremia Resolved   Diabetes mellitus type 2 with hyperglycemia, uncontrolled - Hemoglobin A1c 12.8  -Continue Lantus  95 units subcu daily   Hepatic steatosis - Continue outpatient follow-up   Hypertension - Stable, continue Avapro , amlodipine    Hypokalemia Replete   Obesity class II Estimated body mass index is 37.46 kg/m as calculated from the following:   Height as of this encounter: 5' 4 (1.626 m).   Weight as of this encounter: 99  kg.    Consultants:  Procedures performed:  Disposition: Home Diet recommendation:  Discharge Diet Orders (From admission, onward)     Start     Ordered   08/12/23 0000  Diet - low sodium heart healthy        08/12/23 1037           Carb modified diet DISCHARGE MEDICATION: Allergies as of 08/12/2023   No Known Allergies      Medication List     STOP taking these medications    meloxicam  15 MG tablet Commonly known as: MOBIC        TAKE these medications    amLODipine  10 MG tablet Commonly known as: NORVASC  Take 1 tablet (10 mg total) by mouth daily.   aspirin  81 MG tablet Take 81 mg by mouth daily.   atorvastatin 40 MG tablet Commonly known as: LIPITOR Take 40 mg by mouth daily.   cephALEXin  500 MG capsule Commonly known as: KEFLEX  Take 1 capsule (500 mg total) by mouth 4 (four) times daily for 7 days.   cyclobenzaprine  10 MG tablet Commonly known as: FLEXERIL  Take 1 tablet (10 mg total) by mouth 2 (two) times daily as needed for muscle spasms.   insulin  glargine 100 UNIT/ML injection Commonly known as: LANTUS  Inject 95 Units into the skin at bedtime.   irbesartan  300 MG tablet Commonly known as: AVAPRO  Take 300 mg by mouth daily.   Jardiance 25 MG Tabs tablet Generic drug: empagliflozin Take 25 mg by mouth daily.        Follow-up Information  Koirala, Dibas, MD Follow up in 1 week(s).   Specialty: Family Medicine Why: Check CBC in 1 week for persistent leukocytosis Contact information: [address] [place] [address] [phone]                Discharge Exam: Ashley Keith   08/07/23 1356 08/08/23 0831  Weight: 98.9 kg 99 kg   General-appears in no acute distress Heart-S1-S2, regular, no murmur auscultated Lungs-clear to auscultation bilaterally, no wheezing or crackles auscultated Abdomen-soft, nontender, no organomegaly Extremities-no edema in the lower extremities Neuro-alert, oriented  x3, no focal deficit noted  Condition at discharge: good  The results of significant diagnostics from this hospitalization (including imaging, microbiology, ancillary and laboratory) are listed below for reference.   Imaging Studies: CT ABDOMEN PELVIS WO CONTRAST Result Date: 08/07/2023 CLINICAL DATA:  UTI. EXAM: CT ABDOMEN AND PELVIS WITHOUT CONTRAST TECHNIQUE: Multidetector CT imaging of the abdomen and pelvis was performed following the standard protocol without IV contrast. RADIATION DOSE REDUCTION: This exam was performed according to the departmental dose-optimization program which includes automated exposure control, adjustment of the mA and/or kV according to patient size and/or use of iterative reconstruction technique. COMPARISON:  CT dated 01/16/2018. FINDINGS: Evaluation of this exam is limited in the absence of intravenous contrast. Lower chest: Bibasilar subpleural atelectasis. No intra-abdominal free air or free fluid. Hepatobiliary: Fatty liver. No biliary dilatation. Probable minimal sludge or layering small stones in the gallbladder. No pericholecystic fluid or evidence of acute cholecystitis by CT. Pancreas: Unremarkable. No pancreatic ductal dilatation or surrounding inflammatory changes. Spleen: Normal in size without focal abnormality. Adrenals/Urinary Tract: The adrenal glands unremarkable. Small left renal inter pole fatty lesion similar to prior CT and in keeping with angiomyolipoma. There is no hydronephrosis on physis on either side. The visualized ureters and urinary bladder appear unremarkable. Stomach/Bowel: There is no bowel obstruction or active inflammation. The appendix is normal. Vascular/Lymphatic: Moderate aortoiliac atherosclerotic disease. The IVC is unremarkable. No portal gas. There is no adenopathy. Reproductive: Hysterectomy.  No suspicious adnexal masses. Other: Small fat containing umbilical hernia. Musculoskeletal: Osteopenia with degenerative changes. No acute  osseous pathology. IMPRESSION: 1. No acute intra-abdominal or pelvic pathology. No hydronephrosis or nephrolithiasis. 2. Fatty liver. 3.  Aortic Atherosclerosis (ICD10-I70.0). Electronically Signed   By: Ashley Keith M.D.   On: 08/07/2023 17:36   DG Lumbar Spine Complete Result Date: 07/17/2023 CLINICAL DATA:  Pain. EXAM: LUMBAR SPINE - COMPLETE 4+ VIEW COMPARISON:  Lumbar spine radiographs 06/19/2010. Lumbar MRI 05/24/2011. FINDINGS: Detail limited by body habitus. There are 5 lumbar type vertebral bodies. Minimal convex left scoliosis. The lateral alignment is normal. The disc spaces are preserved. No evidence of acute fracture or pars defect. There are facet degenerative changes inferiorly which have progressed from the prior radiographs. Aortoiliac atherosclerosis noted. IMPRESSION: No acute osseous findings or significant disc degenerative changes. Progression of facet degenerative changes inferiorly. Electronically Signed   By: Elsie Perone M.D.   On: 07/17/2023 11:59    Microbiology: Results for orders placed or performed during the hospital encounter of 08/07/23  Blood culture (routine x 2)     Status: Abnormal   Collection Time: 08/07/23  4:33 PM   Specimen: BLOOD  Result Value Ref Range Status   Specimen Description   Final    BLOOD LEFT ANTECUBITAL Performed at [facility], [address], [place], [place] [address]    Special Requests   Final    BOTTLES DRAWN AEROBIC AND ANAEROBIC  Blood Culture adequate volume Performed at [facility], [address] [place], [place] [address]    Culture  Setup Time   Final    GRAM NEGATIVE RODS ANAEROBIC BOTTLE ONLY CRITICAL RESULT CALLED TO, READ BACK BY AND VERIFIED WITH: PHARMD M LILLISTON 08/09/2023 @ 0018 BY AB Performed at [facility], [address] [place], [place] [address]    Culture ESCHERICHIA COLI (A)  Final   Report Status 08/10/2023 FINAL  Final   Organism ID, Bacteria  ESCHERICHIA COLI  Final   Organism ID, Bacteria ESCHERICHIA COLI  Final      Susceptibility   Escherichia coli - KIRBY BAUER*    CEFAZOLIN  SENSITIVE Sensitive    Escherichia coli - MIC*    AMPICILLIN 4 SENSITIVE Sensitive     CEFEPIME <=0.12 SENSITIVE Sensitive     CEFTAZIDIME <=1 SENSITIVE Sensitive     CEFTRIAXONE  <=0.25 SENSITIVE Sensitive     CIPROFLOXACIN <=0.25 SENSITIVE Sensitive     GENTAMICIN <=1 SENSITIVE Sensitive     IMIPENEM <=0.25 SENSITIVE Sensitive     TRIMETH/SULFA <=20 SENSITIVE Sensitive     AMPICILLIN/SULBACTAM <=2 SENSITIVE Sensitive     PIP/TAZO <=4 SENSITIVE Sensitive ug/mL    * ESCHERICHIA COLI    ESCHERICHIA COLI  Blood Culture ID Panel (Reflexed)     Status: Abnormal   Collection Time: 08/07/23  4:33 PM  Result Value Ref Range Status   Enterococcus faecalis NOT DETECTED NOT DETECTED Final   Enterococcus Faecium NOT DETECTED NOT DETECTED Final   Listeria monocytogenes NOT DETECTED NOT DETECTED Final   Staphylococcus species NOT DETECTED NOT DETECTED Final   Staphylococcus aureus (BCID) NOT DETECTED NOT DETECTED Final   Staphylococcus epidermidis NOT DETECTED NOT DETECTED Final   Staphylococcus lugdunensis NOT DETECTED NOT DETECTED Final   Streptococcus species NOT DETECTED NOT DETECTED Final   Streptococcus agalactiae NOT DETECTED NOT DETECTED Final   Streptococcus pneumoniae NOT DETECTED NOT DETECTED Final   Streptococcus pyogenes NOT DETECTED NOT DETECTED Final   A.calcoaceticus-baumannii NOT DETECTED NOT DETECTED Final   Bacteroides fragilis NOT DETECTED NOT DETECTED Final   Enterobacterales DETECTED (A) NOT DETECTED Final    Comment: Enterobacterales represent a large order of gram negative bacteria, not a single organism. CRITICAL RESULT CALLED TO, READ BACK BY AND VERIFIED WITH: PHARMD M LILLISTON 08/09/2023 @ 0018 BY AB    Enterobacter cloacae complex NOT DETECTED NOT DETECTED Final   Escherichia coli DETECTED (A) NOT DETECTED Final    Comment:  CRITICAL RESULT CALLED TO, READ BACK BY AND VERIFIED WITH: PHARMD M LILLISTON 08/09/2023 @ 0018 BY AB    Klebsiella aerogenes NOT DETECTED NOT DETECTED Final   Klebsiella oxytoca NOT DETECTED NOT DETECTED Final   Klebsiella pneumoniae NOT DETECTED NOT DETECTED Final   Proteus species NOT DETECTED NOT DETECTED Final   Salmonella species NOT DETECTED NOT DETECTED Final   Serratia marcescens NOT DETECTED NOT DETECTED Final   Haemophilus influenzae NOT DETECTED NOT DETECTED Final   Neisseria meningitidis NOT DETECTED NOT DETECTED Final   Pseudomonas aeruginosa NOT DETECTED NOT DETECTED Final   Stenotrophomonas maltophilia NOT DETECTED NOT DETECTED Final   Candida albicans NOT DETECTED NOT DETECTED Final   Candida auris NOT DETECTED NOT DETECTED Final   Candida glabrata NOT DETECTED NOT DETECTED Final   Candida krusei NOT DETECTED NOT DETECTED Final   Candida parapsilosis NOT DETECTED NOT DETECTED Final   Candida tropicalis NOT DETECTED NOT DETECTED Final   Cryptococcus neoformans/gattii NOT DETECTED  NOT DETECTED Final   CTX-M ESBL NOT DETECTED NOT DETECTED Final   Carbapenem resistance IMP NOT DETECTED NOT DETECTED Final   Carbapenem resistance KPC NOT DETECTED NOT DETECTED Final   Carbapenem resistance NDM NOT DETECTED NOT DETECTED Final   Carbapenem resist OXA 48 LIKE NOT DETECTED NOT DETECTED Final   Carbapenem resistance VIM NOT DETECTED NOT DETECTED Final    Comment: Performed at [facility], [address] [place], [place] [address]  Blood culture (routine x 2)     Status: None (Preliminary result)   Collection Time: 08/07/23 11:10 PM   Specimen: BLOOD  Result Value Ref Range Status   Specimen Description   Final    BLOOD BLOOD LEFT HAND Performed at [facility], [address], [place], [place] [address]    Special Requests   Final    BOTTLES DRAWN AEROBIC ONLY Blood Culture results may not be optimal due to an inadequate volume of blood received in  culture bottles Performed at [facility], [address] [place], [place] [address]    Culture   Final    NO GROWTH 4 DAYS Performed at [facility], [address] [place], [place] [address]    Report Status PENDING  Incomplete  Urine Culture (for pregnant, neutropenic or urologic patients or patients with an indwelling urinary catheter)     Status: Abnormal   Collection Time: 08/08/23  6:00 AM   Specimen: Urine, Clean Catch  Result Value Ref Range Status   Specimen Description   Final    URINE, CLEAN CATCH Performed at [facility], [address] [place], [place] [address]    Special Requests   Final    NONE Performed at [facility], [address], [place], [place] [address]    Culture (A)  Final    <10,000 COLONIES/mL INSIGNIFICANT GROWTH Performed at [facility], [address], [place], [place] [address]    Report Status 08/09/2023 FINAL  Final    Labs: CBC: Recent Labs  Lab 08/07/23 1430 08/08/23 0555 08/09/23 0555 08/10/23 0521 08/11/23 0523 08/12/23 0615  WBC 22.2* 23.7* 25.4* 25.7* 23.5* 22.2*  NEUTROABS 17.2*  --  19.5*  --   --   --   HGB 12.3 11.8* 11.9* 11.6* 11.4* 12.2  HCT 37.0 34.5* 36.0 35.8* 33.8* 36.1  MCV 92.3 91.0 94.5 92.5 90.9 91.2  PLT 240 229 299 338 380 416*   Basic Metabolic Panel: Recent Labs  Lab 08/08/23 0555 08/09/23 0555 08/10/23 0521 08/11/23 0523 08/12/23 0615  NA 131* 134* 136 133* 137  K 4.3 3.9 3.5 3.3* 4.4  CL 99 102 105 98 101  CO2 21* 23 23 22 26   GLUCOSE 216* 103* 62* 56* 71  BUN 31* 29* 16 8 6*  CREATININE 1.36* 0.96 0.53 0.63 0.55  CALCIUM 7.9* 8.0* 8.0* 7.9* 8.3*  MG  --  2.1 2.0 2.0 1.9   Liver Function Tests: Recent Labs  Lab 08/07/23 1430 08/08/23 0555  AST 30 24  ALT 25 22  ALKPHOS 188* 174*  BILITOT 1.8* 0.9  PROT 6.6 5.5*  ALBUMIN 2.7* 2.3*   CBG: Recent Labs  Lab 08/11/23 0721 08/11/23 1228 08/11/23 1622  08/11/23 2112 08/12/23 0745  GLUCAP 75 83 105* 62* 80    Discharge time spent: greater than 30 minutes.  Signed: Sabas GORMAN Brod, MD [facility] 08/12/2023

## 2023-08-12 NOTE — Plan of Care (Signed)
  Problem: Education: Goal: Knowledge of General Education information will improve Description: Including pain rating scale, medication(s)/side effects and non-pharmacologic comfort measures 08/12/2023 1058 by Alaina Dozier PARAS, RN Outcome: Adequate for Discharge 08/12/2023 0910 by Alaina Dozier PARAS, RN Outcome: Progressing   Problem: Health Behavior/Discharge Planning: Goal: Ability to manage health-related needs will improve 08/12/2023 1058 by Alaina Dozier PARAS, RN Outcome: Adequate for Discharge 08/12/2023 0910 by Alaina Dozier PARAS, RN Outcome: Progressing   Problem: Clinical Measurements: Goal: Ability to maintain clinical measurements within normal limits will improve 08/12/2023 1058 by Alaina Dozier PARAS, RN Outcome: Adequate for Discharge 08/12/2023 0910 by Alaina Dozier PARAS, RN Outcome: Progressing Goal: Will remain free from infection 08/12/2023 1058 by Alaina Dozier PARAS, RN Outcome: Adequate for Discharge 08/12/2023 0910 by Alaina Dozier PARAS, RN Outcome: Progressing Goal: Diagnostic test results will improve 08/12/2023 1058 by Alaina Dozier PARAS, RN Outcome: Adequate for Discharge 08/12/2023 0910 by Alaina Dozier PARAS, RN Outcome: Progressing Goal: Respiratory complications will improve Outcome: Adequate for Discharge Goal: Cardiovascular complication will be avoided Outcome: Adequate for Discharge   Problem: Activity: Goal: Risk for activity intolerance will decrease Outcome: Adequate for Discharge   Problem: Nutrition: Goal: Adequate nutrition will be maintained Outcome: Adequate for Discharge   Problem: Coping: Goal: Level of anxiety will decrease Outcome: Adequate for Discharge   Problem: Elimination: Goal: Will not experience complications related to bowel motility Outcome: Adequate for Discharge Goal: Will not experience complications related to urinary retention Outcome: Adequate for Discharge   Problem: Pain Managment: Goal:  General experience of comfort will improve and/or be controlled Outcome: Adequate for Discharge   Problem: Safety: Goal: Ability to remain free from injury will improve Outcome: Adequate for Discharge   Problem: Skin Integrity: Goal: Risk for impaired skin integrity will decrease Outcome: Adequate for Discharge   Problem: Education: Goal: Ability to describe self-care measures that may prevent or decrease complications (Diabetes Survival Skills Education) will improve Outcome: Adequate for Discharge Goal: Individualized Educational Video(s) Outcome: Adequate for Discharge   Problem: Coping: Goal: Ability to adjust to condition or change in health will improve Outcome: Adequate for Discharge   Problem: Fluid Volume: Goal: Ability to maintain a balanced intake and output will improve Outcome: Adequate for Discharge   Problem: Health Behavior/Discharge Planning: Goal: Ability to identify and utilize available resources and services will improve Outcome: Adequate for Discharge Goal: Ability to manage health-related needs will improve Outcome: Adequate for Discharge   Problem: Metabolic: Goal: Ability to maintain appropriate glucose levels will improve Outcome: Adequate for Discharge   Problem: Nutritional: Goal: Maintenance of adequate nutrition will improve Outcome: Adequate for Discharge Goal: Progress toward achieving an optimal weight will improve Outcome: Adequate for Discharge   Problem: Skin Integrity: Goal: Risk for impaired skin integrity will decrease Outcome: Adequate for Discharge   Problem: Tissue Perfusion: Goal: Adequacy of tissue perfusion will improve Outcome: Adequate for Discharge

## 2023-08-13 LAB — CULTURE, BLOOD (ROUTINE X 2): Culture: NO GROWTH

## 2023-08-23 ENCOUNTER — Ambulatory Visit: Admitting: Podiatry

## 2023-08-23 DIAGNOSIS — Z09 Encounter for follow-up examination after completed treatment for conditions other than malignant neoplasm: Secondary | ICD-10-CM | POA: Diagnosis not present

## 2023-08-23 DIAGNOSIS — N10 Acute tubulo-interstitial nephritis: Secondary | ICD-10-CM | POA: Diagnosis not present

## 2023-08-23 DIAGNOSIS — D72828 Other elevated white blood cell count: Secondary | ICD-10-CM | POA: Diagnosis not present

## 2023-08-30 DIAGNOSIS — M8580 Other specified disorders of bone density and structure, unspecified site: Secondary | ICD-10-CM | POA: Diagnosis not present

## 2023-08-30 DIAGNOSIS — E042 Nontoxic multinodular goiter: Secondary | ICD-10-CM | POA: Diagnosis not present

## 2023-08-30 DIAGNOSIS — Z794 Long term (current) use of insulin: Secondary | ICD-10-CM | POA: Diagnosis not present

## 2023-08-30 DIAGNOSIS — E1165 Type 2 diabetes mellitus with hyperglycemia: Secondary | ICD-10-CM | POA: Diagnosis not present

## 2023-09-13 ENCOUNTER — Encounter (INDEPENDENT_AMBULATORY_CARE_PROVIDER_SITE_OTHER): Payer: Medicare Other | Admitting: Ophthalmology

## 2023-09-13 DIAGNOSIS — H35033 Hypertensive retinopathy, bilateral: Secondary | ICD-10-CM | POA: Diagnosis not present

## 2023-09-13 DIAGNOSIS — H43813 Vitreous degeneration, bilateral: Secondary | ICD-10-CM

## 2023-09-13 DIAGNOSIS — Z794 Long term (current) use of insulin: Secondary | ICD-10-CM | POA: Diagnosis not present

## 2023-09-13 DIAGNOSIS — E113293 Type 2 diabetes mellitus with mild nonproliferative diabetic retinopathy without macular edema, bilateral: Secondary | ICD-10-CM

## 2023-09-13 DIAGNOSIS — I10 Essential (primary) hypertension: Secondary | ICD-10-CM

## 2023-09-13 DIAGNOSIS — D3131 Benign neoplasm of right choroid: Secondary | ICD-10-CM

## 2023-09-13 DIAGNOSIS — H2511 Age-related nuclear cataract, right eye: Secondary | ICD-10-CM

## 2023-11-24 DIAGNOSIS — Z23 Encounter for immunization: Secondary | ICD-10-CM | POA: Diagnosis not present

## 2023-12-29 NOTE — Progress Notes (Addendum)
 Ashley Keith                                          MRN: [id number]   03/15/2024   The [facility] Quality Team Specialist reviewed this patient medical record for the purposes of chart review for care gap closure. The following were reviewed: chart review for care gap closure-glycemic status assessment and kidney health evaluation for diabetes:uACR.    [facility] Quality Team

## 2024-09-11 ENCOUNTER — Encounter (INDEPENDENT_AMBULATORY_CARE_PROVIDER_SITE_OTHER): Admitting: Ophthalmology
